# Patient Record
Sex: Female | Born: 2000
Health system: Southern US, Community
[De-identification: ages and names within clinical notes are randomized; demographics above are authoritative.]

## PROBLEM LIST (undated history)

## (undated) DIAGNOSIS — E039 Hypothyroidism, unspecified: Secondary | ICD-10-CM

## (undated) DIAGNOSIS — R7303 Prediabetes: Secondary | ICD-10-CM

## (undated) DIAGNOSIS — E119 Type 2 diabetes mellitus without complications: Secondary | ICD-10-CM

## (undated) HISTORY — DX: Hypothyroidism, unspecified: E03.9

---
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
## 2021-01-20 ENCOUNTER — Encounter (INDEPENDENT_AMBULATORY_CARE_PROVIDER_SITE_OTHER): Payer: Self-pay | Admitting: Psychology

## 2021-02-05 ENCOUNTER — Ambulatory Visit (INDEPENDENT_AMBULATORY_CARE_PROVIDER_SITE_OTHER): Payer: Medicaid Other | Admitting: Pediatric Endocrinology

## 2021-02-06 ENCOUNTER — Telehealth (INDEPENDENT_AMBULATORY_CARE_PROVIDER_SITE_OTHER): Payer: Self-pay | Admitting: Pediatric Endocrinology

## 2021-02-06 NOTE — Telephone Encounter (Signed)
  Who's calling (name and relationship to patient) :Self/ [name]   Best contact number:[phone]  Provider they see:Dr. Vanessa Dawson   Reason for call:[name] Young called because she missed her appointment yesterday, she stated it was due to not having insurance and she thought that she could not be seen. She stated that she will call back in august to schedule when her new insurance is active.      PRESCRIPTION REFILL ONLY  Name of prescription:  Pharmacy:

## 2021-03-12 ENCOUNTER — Other Ambulatory Visit (HOSPITAL_COMMUNITY): Payer: Self-pay

## 2021-03-12 ENCOUNTER — Other Ambulatory Visit (HOSPITAL_BASED_OUTPATIENT_CLINIC_OR_DEPARTMENT_OTHER): Payer: Self-pay

## 2021-04-05 NOTE — Progress Notes (Signed)
Erroneous encounter

## 2021-04-10 ENCOUNTER — Encounter: Payer: Self-pay | Admitting: Family

## 2021-04-10 DIAGNOSIS — Z7689 Persons encountering health services in other specified circumstances: Secondary | ICD-10-CM

## 2021-06-02 ENCOUNTER — Telehealth (INDEPENDENT_AMBULATORY_CARE_PROVIDER_SITE_OTHER): Payer: Self-pay | Admitting: Pharmacist

## 2021-06-02 ENCOUNTER — Encounter (INDEPENDENT_AMBULATORY_CARE_PROVIDER_SITE_OTHER): Payer: Self-pay

## 2021-06-02 NOTE — Telephone Encounter (Signed)
Called patient on 06/02/2021 at 2:48 PM and left HIPAA-compliant VM with instructions to call [facility] back.  Called patient's mother afterwards. She confirmed Courtney has family planning insuracne, but also has other insurance. She states they have a pharmacy insurance card and Amanie is at home and is able to give that information to me. Mom advised me to call to speak with [name]. Advised mother I will re-attempt to contact [name] and if I am unsuccessful I will reach out via MyChart.   Reattempted to call [name] without success. Will contact her via Mychart.  Thank you for involving clinical pharmacist/diabetes educator to assist in providing this patient's care.   Zachery Conch, PharmD, BCACP, CDCES, CPP

## 2021-06-02 NOTE — Telephone Encounter (Signed)
Patient called back - she will email me pictures of the front and back of her Friday insurance card.

## 2021-06-05 ENCOUNTER — Telehealth (INDEPENDENT_AMBULATORY_CARE_PROVIDER_SITE_OTHER): Payer: Self-pay

## 2021-06-05 DIAGNOSIS — E1065 Type 1 diabetes mellitus with hyperglycemia: Secondary | ICD-10-CM

## 2021-06-05 NOTE — Telephone Encounter (Signed)
Patient's insurance cards were just received. I am scanning them into system now. Patient states she is almost out of medication.

## 2021-06-05 NOTE — Telephone Encounter (Signed)
Attempted PA for Humalog Pen, received this message:

## 2021-06-08 ENCOUNTER — Telehealth (INDEPENDENT_AMBULATORY_CARE_PROVIDER_SITE_OTHER): Payer: Self-pay | Admitting: Pediatric Endocrinology

## 2021-06-08 NOTE — Telephone Encounter (Signed)
Attempted PA thru covermymeds with new insurance info, rejected:

## 2021-06-08 NOTE — Telephone Encounter (Signed)
Please re-read prior encounter entered on 06/08/21 at 9:51 am by myself  Patient's name was entered incorrectly as [place] when it is [name]. Also member ID is solely [id number]. Typically when an insurance card has an ID number [id number] the -03 is the dependent status and does not have to be entered.    It was able to submitted appropriately and clinical questions are being requested at this point.   Please enter Key: [id number] on covermymeds and complete prior authorization.  Please do not re-initiate prior authorization

## 2021-06-08 NOTE — Telephone Encounter (Signed)
Patient's name was entered incorrectly as [place] when it is [name]. Also member ID is solely [id number]. Typically when an insurance card has an ID number [id number] the -03 is the dependent status and does not have to be entered.   It was able to submitted appropriately and clinical questions are being requested at this point.  Please enter Key: [id number] on covermymeds and complete prior authorization.  Thank you for involving clinical pharmacist/diabetes educator to assist in providing this patient's care.   Zachery Conch, PharmD, BCACP, CDCES, CPP

## 2021-06-08 NOTE — Telephone Encounter (Signed)
  Who's calling (name and relationship to patient) :Self/ [name] Young   Best contact number:[phone]  Provider they see:Dr. Vanessa [place]   Reason for call:Medication Refill, Patient stated that the pharmacy told her they are still waiting on the prior auth. [name] Young stated that she is leaving to go out of town tomorrow and will not have any to take with her.      PRESCRIPTION REFILL ONLY  Name of prescription:Humalog   Pharmacy:[facility] [place]

## 2021-06-08 NOTE — Telephone Encounter (Signed)
Corrected pt information to state "[name] Young" no abbreviations and re-entered insurance correctly to only include " [id number]" and no additional numbers. PA initiated:

## 2021-06-16 NOTE — Telephone Encounter (Signed)
Humalog Kwickpen 100/unit/ML solution pen injector has been denied per fax received from [employer].

## 2021-06-16 NOTE — Telephone Encounter (Signed)
Looked up patient's drug formulary per insurance coverage  [email or web address] [id number]  It appears patient's insurance plan will only cover 30 day supply (not 90 day supply; 15 mL is 30 day supply and 45 mL is a 90 day supply). It technically does not require a PA (only requires PA for 90 day supply)  I would call the pharmacy and request it be filled for 30 day supply then see if prescription is able to go through insurance.  If unsuccessful then I would contact insurance company [phone]) to explain PA was denied and request to know which alternative rapid acting insulin is covered (insulin aspart (Novolog), insulin lispro (Admelog), insulin glulisine (Apidra). All of these alternative insulins are 100 units/mL and come in a box of 5 pens (total 15 mL) for a 30 day supply   Thank you for involving clinical pharmacist/diabetes educator to assist in providing this patient's care.   Zachery Conch, PharmD, BCACP, CDCES, CPP

## 2021-06-16 NOTE — Telephone Encounter (Signed)
The Determination states:  "Your medication request has been denied as it does not appear to meet medically necessary requirements. Plan rules require clinical parameters (diagnosis, lab values, test results, physical exam findings etc.) be met for medical necessity approval. Information submitted does not indicate required parameter results were met. Coverage for Humalog is denied. It does not meet medical necessity. Humalog has been requested for the treatment of Type 1 diabetes. The information provided by your prescriber does not meet [employer]'s guideline. The guideline used is : Rapid to Intermediate Acting Insulin Prior Authorization with Optional Quantity Limit Criteria. Information provided does not show that the policy requirements have been met.  The requirements not met are:  -an intolerance or hypersensitivity to all the preferred insulin agents of the same type (rapid or regular, mix or NPH) that is not expected to occur with the requested agent; OR -an FDA labeled contraindication to the all preferred insulin agents of the same type (rapid or regular, mix or NPH) that is not expected to occur with the requested agent; OR information indicating a physical or a mental disability that would prevent the use of all the preferred insulin agents; OR Currently pregnant" 

## 2021-06-18 ENCOUNTER — Other Ambulatory Visit (HOSPITAL_BASED_OUTPATIENT_CLINIC_OR_DEPARTMENT_OTHER): Payer: Self-pay

## 2021-06-18 ENCOUNTER — Other Ambulatory Visit (HOSPITAL_COMMUNITY): Payer: Self-pay

## 2021-06-18 MED ORDER — INSULIN ASPART 100 UNIT/ML FLEXPEN
50.0000 [IU] | PEN_INJECTOR | Freq: Every day | SUBCUTANEOUS | 6 refills | Status: DC
  Filled 2021-06-18 (×5): qty 15, 30d supply, fill #0

## 2021-06-18 NOTE — Telephone Encounter (Signed)
Left message for pt telling her of novalog at  [facility].

## 2021-06-18 NOTE — Telephone Encounter (Signed)
Spoke to [place] at [facility]. She stated that the following insulins do not need a PA:  Fiasp Lantus Levemir Tresiba Toujeo Novalog  Since Novalog was on the list provided to me by Dr. Ladona Ridgel, I will resend prescription to pharmacy after provider approval the medication for the pt.

## 2021-06-18 NOTE — Addendum Note (Signed)
Addended by: Buena Irish on: 06/18/2021 12:02 PM   Modules accepted: Orders

## 2021-06-18 NOTE — Telephone Encounter (Signed)
Sent in rx for Novolog   [facility]  [address], [place] [address]  Phone:  [phone]  Fax:  [phone]  DEA #:  [id number]     Quantity remaining: 90 mL Quantity used: 15 mL Next fill due: 06/18/2021  DAW Reason: --  Default refill request to: [facility]    Please notify paitent  Thank you for involving clinical pharmacist/diabetes educator to assist in providing this patient's care.   Zachery Conch, PharmD, BCACP, CDCES, CPP

## 2021-06-19 NOTE — Telephone Encounter (Signed)
Lvm for pt to call back. 

## 2021-06-29 ENCOUNTER — Telehealth (INDEPENDENT_AMBULATORY_CARE_PROVIDER_SITE_OTHER): Payer: Self-pay | Admitting: Pediatric Endocrinology

## 2021-06-29 NOTE — Telephone Encounter (Signed)
  Who's calling (name and relationship to patient) : [name] - self  Best contact number: [phone]  Provider they see: Dr. Vanessa [place]  Reason for call: Patient states that her insulin requires a prior auth.    PRESCRIPTION REFILL ONLY  Name of prescription:  Pharmacy:

## 2021-06-30 ENCOUNTER — Other Ambulatory Visit (INDEPENDENT_AMBULATORY_CARE_PROVIDER_SITE_OTHER): Payer: Self-pay

## 2021-06-30 DIAGNOSIS — E1065 Type 1 diabetes mellitus with hyperglycemia: Secondary | ICD-10-CM

## 2021-06-30 MED ORDER — INSULIN ASPART 100 UNIT/ML FLEXPEN: 50.0000 [IU] | PEN_INJECTOR | Freq: Every day | SUBCUTANEOUS | 6 refills | Status: DC

## 2021-06-30 MED ORDER — LANTUS SOLOSTAR 100 UNIT/ML ~~LOC~~ SOPN: PEN_INJECTOR | SUBCUTANEOUS | 5 refills | Status: DC

## 2021-06-30 NOTE — Telephone Encounter (Signed)
Spoke to patient. Pt requested refils on Humalog and tresiba. I told her that the insurance approved her for Lantus and Novalog instead, pt stated understanding. Pt also expressed that she would like her prescriptions sent to [facility] on [name] [place].    I re-ordered/ placed orders for the correct prescriptions for Novalog and Lantus per Dr Vanessa [place].   Reference was to previous phone note labeled "attempted PA for humalog pen" that stated the following:   "Spoke to [place] at [facility]. She stated that the following insulins do not need a PA:  Fiasp Lantus Levemir [name] Toujeo Novalog"

## 2021-07-01 ENCOUNTER — Telehealth (INDEPENDENT_AMBULATORY_CARE_PROVIDER_SITE_OTHER): Payer: Self-pay

## 2021-07-01 NOTE — Telephone Encounter (Signed)
See phone note labled medication Problem

## 2021-07-01 NOTE — Telephone Encounter (Signed)
Mom called stating that [name] Young had no insulin, long acting.  I told mom that when I spoke to [name] Young yesterday she told me of switching pharmacies to the [facility] on [address] in [place], that I had called them and informed them of the switch and that I was sending those medications in  Lantus and Novalog.  I also explained to mom that insurance did not approve [name] and Humalog and that is why she is now going to get Lantus and Novalog. (See previous phone note ' attempted Humalog PA' for insurance acceptable insulins) Mom stated understanding, and I told mom to call the pharmacy right after we hung up to check if meds were ready for pickup and if they weren't ready to call me back.

## 2021-07-01 NOTE — Telephone Encounter (Signed)
°  Who's calling (name and relationship to patient) :mom/ Joann   Best contact number:[phone]  Provider they see:Dr. Vanessa [place]   Reason for call:Mom stated that Salinda has been without medication and the pharmacy has told her that they the office has still not sent the PA for these medications.      PRESCRIPTION REFILL ONLY  Name of prescription:Humalog, Lantus   Pharmacy:[facility] [address] [place], [place]

## 2021-09-14 ENCOUNTER — Other Ambulatory Visit: Payer: Self-pay

## 2021-09-14 ENCOUNTER — Ambulatory Visit (INDEPENDENT_AMBULATORY_CARE_PROVIDER_SITE_OTHER): Payer: 59 | Admitting: Pediatric Endocrinology

## 2021-09-14 ENCOUNTER — Encounter (INDEPENDENT_AMBULATORY_CARE_PROVIDER_SITE_OTHER): Payer: Self-pay | Admitting: Pediatric Endocrinology

## 2021-09-14 VITALS — BP 120/76 | HR 106 | Wt 183.2 lb

## 2021-09-14 DIAGNOSIS — G63 Polyneuropathy in diseases classified elsewhere: Secondary | ICD-10-CM

## 2021-09-14 DIAGNOSIS — E349 Endocrine disorder, unspecified: Secondary | ICD-10-CM

## 2021-09-14 DIAGNOSIS — E1065 Type 1 diabetes mellitus with hyperglycemia: Secondary | ICD-10-CM

## 2021-09-14 LAB — POCT GLUCOSE (DEVICE FOR HOME USE): POC Glucose: 376 mg/dl — AB (ref 70–99)

## 2021-09-14 LAB — POCT GLYCOSYLATED HEMOGLOBIN (HGB A1C): Hemoglobin A1C: 13 % — AB (ref 4.0–5.6)

## 2021-09-14 MED ORDER — TRESIBA FLEXTOUCH 100 UNIT/ML ~~LOC~~ SOPN: PEN_INJECTOR | SUBCUTANEOUS | 1 refills | Status: DC

## 2021-09-14 MED ORDER — DEXCOM G6 TRANSMITTER MISC: 1.0000 | 3 refills | Status: AC

## 2021-09-14 MED ORDER — FIASP FLEXTOUCH 100 UNIT/ML ~~LOC~~ SOPN: PEN_INJECTOR | SUBCUTANEOUS | 5 refills | Status: DC

## 2021-09-14 MED ORDER — DEXCOM G6 SENSOR MISC: 1.0000 | 11 refills | Status: DC

## 2021-09-14 NOTE — Progress Notes (Signed)
Pediatric Endocrinology Diabetes Consultation Follow-up Visit  SUPRIYA BEASTON [birth year] [id number]  Chief Complaint: Follow-up type 1 diabetes   Inc, [facility]   HPI: Cecylia  is a 21 y.o. female presenting for follow-up of type 1 diabetes. she is unaccompanied.   1. Brailee was first referred to [place] for concerns regarding hypothyroidism and obesity. At her initial visit she was found to have an elevated A1C of 6.6%. She has a strong family history of type 2 diabetes and her family believed that she was following the same pattern. About 1 month after our initial consult she was admitted to [facility] from [facility] with a blood sugar of 674 mg/dL on 06/14/11. She had been complaining of stomach pains. Her mom checked her on mom's meter and found that the read was HI. Mom had been checking Ninette's sugars about 1-2 x per week since her last visit and stated that none of the reads had been higher than 147. She was transferred from [facility] to [facility] and diagnosed with hyperglycemia secondary to diabetes. She had glycosuria but no ketones.  She was started on both Metformin and MDI with Lantus and Novolog. Given her strong family history of type 2 diabetes and acanthosis nigricans, Cassiopeia was at risk for type 2 diabetes. Given her history of Hashimoto Thyroiditis, she was also at risk for Type 1 diabetes. 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    2. Since last visit to [facility] on 07/28/20 , she has been doing okay.   She is currently getting Lantus and Humalog. She is paying $20 for each at the pharmacy. She is taking Lantus 30 units 3-4 times a week. She is taking it at bedtime. If she forgets it she just takes Humalog the next day.   She is checking her BG about 3-4 times a week. She is taking her Humalog daily- she thinks that she does this more often than she  checks her sugar.   She has been having leg pains. She says that she has neuropathy. She has not yet seen neurology. She is getting foot cramps in the middle of the night.   She has had a few low sugars- but not bad. She was 62 this morning.    Thyroid She is not taking any of her thyroid medication.   She has been somewhat more tired. She does not nap "I don't have time for naps" She is getting her period monthly No issues with her hair or skin.  She is constipated She is often cold.    Microalbumin - not currently taking Lisinopril regularly.  - She does not remember the last time she took it.    Insulin regimen:   Lantus 30  Humalog 120/30/10 plan- mostly making it up as she goes.   Hypoglycemia: Occasional hypoglycemia- mostly when she doesn't eat. some overnight. No glucagon needed.  Blood glucose download:      Dexcom : not currently wearing     Med-alert ID: Not currently wearing. - need to get one.  Injection sites: abdomen, arms and legs   Annual labs due: Due today Ophthalmology due: done 2022   3. ROS:  Greater than 10 systems reviewed with pertinent positives listed in HPI, otherwise neg.    Constitutional: She is feeling "OK".. normal appetite  Eyes: She has glasses now- she feels that she needs a stronger prescription Ears/Nose/Mouth/Throat:  No difficulty swallowing. Cardiovascular: No palpitations Respiratory: No increased work of breathing.  Gastrointestinal: Recent constipation Neurologic: Normal sensation, no tremor- new neuropathy Endocrine: No polydipsia.  No hyperpigmentation Psychiatric: appropriate LMP 2/5- periods are regular  Past Medical History:   Past Medical History:  Diagnosis Date   Borderline diabetes    Diabetes mellitus without complication ([place])    Hypothyroid    Hypothyroidism     Medications:  Outpatient Encounter Medications as of 09/14/2021  Medication Sig   Continuous Blood Gluc Sensor (DEXCOM G6 SENSOR) MISC 1 each  by Does not apply route as directed. 1 sensor every 10 days   Continuous Blood Gluc Transmit (DEXCOM G6 TRANSMITTER) MISC 1 each by Does not apply route every 3 (three) months.   glucose blood (FREESTYLE LITE) test strip Check glucose 6x daily   glucose monitoring kit (FREESTYLE) monitoring kit 1 each by Does not apply route as needed for other.   insulin aspart (FIASP FLEXTOUCH) 100 UNIT/ML FlexTouch Pen Inject up to 50 units subcutaneously daily as instructed.   insulin degludec (TRESIBA FLEXTOUCH) 100 UNIT/ML FlexTouch Pen Use as directed up to 45 units per day   Insulin Pen Needle (BD PEN NEEDLE NANO U/F) 32G X 4 MM MISC Use with insulin pen 6x daily   Ketone Blood Test STRP Use as directed   levothyroxine (SYNTHROID) 75 MCG tablet TAKE 1 TABLET BY MOUTH ONCE DAILY.   [DISCONTINUED] insulin aspart (NOVOLOG) 100 UNIT/ML FlexPen Inject up to 50 Units into the skin daily per provider guidance   [DISCONTINUED] insulin glargine (LANTUS SOLOSTAR) 100 UNIT/ML Solostar Pen Inject up to 50 Units daily   Glucagon (BAQSIMI TWO PACK) 3 MG/DOSE POWD Place 1 spray into the nose as directed. Please apply copay card. BIN [id number]. PCN [id number]. GROUP [id number]. ID [id number]. Thanks! (Patient not taking: Reported on 04/24/2020)   glucagon 1 MG injection Give 1 mg IM for severe hypoglycemia with seizure or unconsciousness.   lisinopril (ZESTRIL) 5 MG tablet Take 1 tablet (5 mg total) by mouth daily. (Patient not taking: Reported on 09/14/2021)   [DISCONTINUED] Continuous Blood Gluc Sensor (DEXCOM G6 SENSOR) MISC Inject 1 applicator into the skin as directed. (change sensor every 10 days) (Patient not taking: Reported on 09/14/2021)   [DISCONTINUED] Continuous Blood Gluc Transmit (DEXCOM G6 TRANSMITTER) MISC Inject 1 Device into the skin as directed. (re-use up to 8x with each new sensor) (Patient not taking: Reported on 09/14/2021)   No facility-administered encounter medications on file as of 09/14/2021.    Allergies:   Allergies  Allergen Reactions   Apple Juice Itching    Throat itching and burning   Latex     Skin becomes red and itchy    Surgical History: No past surgical history on file.  Family History:  Family History  Problem Relation Age of Onset   Diabetes Mother    Obesity Mother    Thyroid disease Mother    Diabetes Maternal Grandmother    Hypertension Maternal Grandfather    Thyroid disease Maternal Grandfather    Asthma Brother       Social History:   Lives with: Mother, father and younger brother   Graduated HS Working at [employer]   Physical Exam:   Vitals:   09/14/21 0933  BP: 120/76  Pulse: (!) 106  Weight: 183 lb 3.2 oz (83.1 kg)    BP 120/76 (BP Location: Right Arm, Patient Position: Sitting, Cuff Size: Normal)    Pulse (!) 106    Wt  183 lb 3.2 oz (83.1 kg)    BMI 28.69 kg/m  Body mass index: body mass index is 28.69 kg/m.  Facility age limit for growth percentiles is 20 years.  Growth percentile SmartLinks can only be used for patients less than 49 years old.  Ht Readings from Last 3 Encounters:  05/30/20 '5\' 7"'  (1.702 m) (86 %, Z= 1.07)*  03/05/20 5' 7.72" (1.72 m) (91 %, Z= 1.36)*  01/23/20 5' 7.87" (1.724 m) (92 %, Z= 1.42)*   * Growth percentiles are based on CDC (Girls, 2-20 Years) data.   Wt Readings from Last 3 Encounters:  09/14/21 183 lb 3.2 oz (83.1 kg)  07/28/20 172 lb (78 kg) (93 %, Z= 1.47)*  05/30/20 170 lb (77.1 kg) (93 %, Z= 1.44)*   * Growth percentiles are based on CDC (Girls, 2-20 Years) data.    General: Well developed, well nourished female in no acute distress.  Alert and oriented. Weight is plus 11 pounds since last visit. (1 year) Head: Normocephalic, atraumatic.   Eyes:  Pupils equal and round. EOMI.   Sclera white.  No eye drainage.   Ears/Nose/Mouth/Throat: Nares patent, no nasal drainage.  Normal dentition, mucous membranes moist.   Neck: supple, no cervical lymphadenopathy, Thyroid enlarged, firm, slightly tender,  today Cardiovascular: regular rate, normal S1/S2, no murmurs Respiratory: No increased work of breathing.  Lungs clear to auscultation bilaterally.  No wheezes. Abdomen: mildly distended. Normal bowel sounds.  No appreciable masses. LLQ tenderness.  Extremities: warm, well perfused, cap refill < 2 sec.   Musculoskeletal: Normal muscle mass.  Normal strength Skin: warm, dry.  No rash or lesions. Neurologic: alert and oriented, normal speech, no tremor  Labs:     Lab Results  Component Value Date   HGBA1C 13.0 (A) 09/14/2021   HGBA1C 13.0 (A) 07/28/2020   HGBA1C 10.9 (A) 04/24/2020     Results for orders placed or performed in visit on 09/14/21  POCT Glucose (Device for Home Use)  Result Value Ref Range   Glucose Fasting, POC     POC Glucose 376 (A) 70 - 99 mg/dl  POCT glycosylated hemoglobin (Hb A1C)  Result Value Ref Range   Hemoglobin A1C 13.0 (A) 4.0 - 5.6 %   HbA1c POC (<> result, manual entry)     HbA1c, POC (prediabetic range)     HbA1c, POC (controlled diabetic range)      Assessment/Plan: Pleshette is a 21 y.o. female with uncontrolled type 1 diabetes on MDI. She has been lost to follow up for the past year.   1. DM w/o complication type I, uncontrolled (HCC)/Hyperglycemia / Neuropathy - 30 units of Lantus  - Has been taking dose about 3 times a week  - Lantus requires PA on her new insurance  - Will switch back to [place] which does not appear to require PA on her insurance (per website) - Humalog   120/30/10 scale- but she is mostly making up doses  - Humalog requires PA on her current insurance  - Will switch to [place] which does not appear to require PA on her insurance website  - Discussed possibly starting insulin pump if we can get her doses closer to her actual insulin dose requirements Hemoglobin A1C  - Her value is far above the ADA recommended value of <7%  - Discussed increased risk of complications with high Y1O results  - She is already having  increasing neuropathy Neuropathy  - Lower extremity/ foot pain  - Bruising sensation,  walking on sharp object pain, and tingling pain  - Worse at night/ in bed  - normal sensation to monofilament testing on exam today  - She declines Gabapentin at this time but states that she will work on improving glycemic control Continuous Glucose Monitoring  - Her insurance appears to cover Dexcom 6 but not Dexcom 7  - Rx for CGM supplies to pharmacy today  2. Medical non-compliance   - Discussed barriers to care.  - Discussed challenges with missed appointments, inadequate data, and inconsistent dosing  3. Hypothyroid - 75 mcg of Levothyroxine was her last dose - She has not been taking this for a long time - Gland is large and slightly tender on exam today - she denies overt symptoms of hypothyroidism  4. Microalbuminuria - Not taking Lisinopril - BP stable  Orders Placed This Encounter  Procedures   TSH   T4, free   CBC with Differential/Platelet   Comprehensive metabolic panel   Lipid panel   Microalbumin / creatinine urine ratio   POCT Glucose (Device for Home Use)   POCT urinalysis dipstick   POCT glycosylated hemoglobin (Hb A1C)   COLLECTION CAPILLARY BLOOD SPECIMEN   Meds ordered this encounter  Medications   Continuous Blood Gluc Sensor (DEXCOM G6 SENSOR) MISC    Sig: 1 each by Does not apply route as directed. 1 sensor every 10 days    Dispense:  3 each    Refill:  11   Continuous Blood Gluc Transmit (DEXCOM G6 TRANSMITTER) MISC    Sig: 1 each by Does not apply route every 3 (three) months.    Dispense:  1 each    Refill:  3   insulin degludec (TRESIBA FLEXTOUCH) 100 UNIT/ML FlexTouch Pen    Sig: Use as directed up to 45 units per day    Dispense:  15 mL    Refill:  1   insulin aspart (FIASP FLEXTOUCH) 100 UNIT/ML FlexTouch Pen    Sig: Inject up to 50 units subcutaneously daily as instructed.    Dispense:  15 mL    Refill:  5     Follow-up:  Return in about 1  month (around 10/12/2021).    Medical decision-making:  >40 minutes spent today reviewing the medical chart, counseling the patient/family, and documenting today's encounter.   When a patient is on insulin, intensive monitoring of blood glucose levels is necessary to avoid hyperglycemia and hypoglycemia. Severe hyperglycemia/hypoglycemia can lead to hospital admissions and be life threatening.   Lelon Huh, MD  Pediatric Specialist  [address] [place]  [place], [address]  Tele: [phone]

## 2021-09-15 LAB — LIPID PANEL
Cholesterol: 237 mg/dL — ABNORMAL HIGH (ref <200)
HDL: 76 mg/dL (ref >=50)
LDL Cholesterol (Calc): 134 mg/dL (calc) — ABNORMAL HIGH
Non-HDL Cholesterol (Calc): 161 mg/dL (calc) — ABNORMAL HIGH (ref <130)
Total CHOL/HDL Ratio: 3.1 (calc) (ref <5.0)
Triglycerides: 138 mg/dL (ref <150)

## 2021-09-15 LAB — COMPREHENSIVE METABOLIC PANEL
AG Ratio: 1.1 (calc) (ref 1.0–2.5)
ALT: 18 U/L (ref 6–29)
AST: 20 U/L (ref 10–30)
Albumin: 4 g/dL (ref 3.6–5.1)
Alkaline phosphatase (APISO): 134 U/L — ABNORMAL HIGH (ref 31–125)
BUN: 9 mg/dL (ref 7–25)
CO2: 23 mmol/L (ref 20–32)
Calcium: 9.1 mg/dL (ref 8.6–10.2)
Chloride: 95 mmol/L — ABNORMAL LOW (ref 98–110)
Creat: 0.66 mg/dL (ref 0.50–0.96)
Globulin: 3.6 g/dL (calc) (ref 1.9–3.7)
Glucose, Bld: 395 mg/dL — ABNORMAL HIGH (ref 65–99)
Potassium: 3.8 mmol/L (ref 3.5–5.3)
Sodium: 133 mmol/L — ABNORMAL LOW (ref 135–146)
Total Bilirubin: 0.6 mg/dL (ref 0.2–1.2)
Total Protein: 7.6 g/dL (ref 6.1–8.1)

## 2021-09-15 LAB — CBC WITH DIFFERENTIAL/PLATELET
Absolute Monocytes: 441 cells/uL (ref 200–950)
Basophils Absolute: 38 cells/uL (ref 0–200)
Basophils Relative: 0.6 %
Eosinophils Absolute: 69 cells/uL (ref 15–500)
Eosinophils Relative: 1.1 %
HCT: 37 % (ref 35.0–45.0)
Hemoglobin: 10.9 g/dL — ABNORMAL LOW (ref 11.7–15.5)
Lymphs Abs: 1424 cells/uL (ref 850–3900)
MCH: 23.7 pg — ABNORMAL LOW (ref 27.0–33.0)
MCHC: 29.5 g/dL — ABNORMAL LOW (ref 32.0–36.0)
MCV: 80.4 fL (ref 80.0–100.0)
MPV: 10.6 fL (ref 7.5–12.5)
Monocytes Relative: 7 %
Neutro Abs: 4328 cells/uL (ref 1500–7800)
Neutrophils Relative %: 68.7 %
Platelets: 376 10*3/uL (ref 140–400)
RBC: 4.6 10*6/uL (ref 3.80–5.10)
RDW: 13.5 % (ref 11.0–15.0)
Total Lymphocyte: 22.6 %
WBC: 6.3 10*3/uL (ref 3.8–10.8)

## 2021-09-15 LAB — TSH: TSH: 5.52 mIU/L — ABNORMAL HIGH

## 2021-09-15 LAB — MICROALBUMIN / CREATININE URINE RATIO
Creatinine, Urine: 40 mg/dL (ref 20–275)
Microalb Creat Ratio: 13 mcg/mg creat (ref <30)
Microalb, Ur: 0.5 mg/dL

## 2021-09-15 LAB — T4, FREE: Free T4: 1 ng/dL (ref 0.8–1.4)

## 2021-09-28 ENCOUNTER — Other Ambulatory Visit (HOSPITAL_BASED_OUTPATIENT_CLINIC_OR_DEPARTMENT_OTHER): Payer: Self-pay

## 2021-10-12 ENCOUNTER — Other Ambulatory Visit: Payer: Self-pay

## 2021-10-12 ENCOUNTER — Ambulatory Visit (INDEPENDENT_AMBULATORY_CARE_PROVIDER_SITE_OTHER): Payer: 59 | Admitting: Pediatric Endocrinology

## 2021-10-12 VITALS — BP 120/90 | Wt 177.8 lb

## 2021-10-12 DIAGNOSIS — E1065 Type 1 diabetes mellitus with hyperglycemia: Secondary | ICD-10-CM

## 2021-10-12 DIAGNOSIS — G6289 Other specified polyneuropathies: Secondary | ICD-10-CM | POA: Diagnosis not present

## 2021-10-12 DIAGNOSIS — E034 Atrophy of thyroid (acquired): Secondary | ICD-10-CM | POA: Diagnosis not present

## 2021-10-12 LAB — POCT GLUCOSE (DEVICE FOR HOME USE): Glucose Fasting, POC: 87 mg/dL (ref 70–99)

## 2021-10-12 MED ORDER — FIASP FLEXTOUCH 100 UNIT/ML ~~LOC~~ SOPN: PEN_INJECTOR | SUBCUTANEOUS | 5 refills | Status: AC

## 2021-10-12 MED ORDER — FREESTYLE LIBRE 3 SENSOR MISC: 1.0000 | 11 refills | Status: DC

## 2021-10-12 MED ORDER — LEVOTHYROXINE SODIUM 25 MCG PO TABS: 25.0000 ug | ORAL_TABLET | Freq: Every day | ORAL | 3 refills | Status: DC

## 2021-10-12 MED ORDER — ACCU-CHEK GUIDE W/DEVICE KIT: PACK | 1 refills | Status: AC

## 2021-10-12 MED ORDER — GLUCOSE BLOOD VI STRP: ORAL_STRIP | 12 refills | Status: DC

## 2021-10-12 MED ORDER — BAQSIMI TWO PACK 3 MG/DOSE NA POWD: 1.0000 | NASAL | 3 refills | Status: AC

## 2021-10-12 MED ORDER — ACCU-CHEK MULTICLIX LANCETS MISC: 12 refills | Status: DC

## 2021-10-12 MED ORDER — TRESIBA FLEXTOUCH 100 UNIT/ML ~~LOC~~ SOPN: PEN_INJECTOR | SUBCUTANEOUS | 1 refills | Status: AC

## 2021-10-12 MED ORDER — ACCU-CHEK GUIDE VI STRP: ORAL_STRIP | 3 refills | Status: AC

## 2021-10-12 MED ORDER — ACCU-CHEK FASTCLIX LANCETS MISC: 3 refills | Status: AC

## 2021-10-12 NOTE — Patient Instructions (Addendum)
?  All your scripts are at [facility] ? ?If you have any issues filling them- please send me a MyChart.  ?Otherwise- I will expect that you are using all your new meds at your next visit! ? ?Call Environmental manager at [facility] and ask if they have had a chance to review your resume. Don't be afraid to be pushy.  ? ?Sample of Libre 3 provided in clinic today. Rx sent to pharmacy. If it is denied- call the number on the bag: [phone] ? ? ?

## 2021-10-12 NOTE — Progress Notes (Signed)
Pediatric Endocrinology Diabetes Consultation Follow-up Visit ? ?Lorane Gell ?2000-12-11 ?[id number] ? ?Chief Complaint: Follow-up type 1 diabetes ? ? ?Inc, [facility] ? ? ?HPI: ?[name] Young  is a 21 y.o. female presenting for follow-up of type 1 diabetes. she is unaccompanied.  ? ?1. Latona was first referred to [place] for concerns regarding hypothyroidism and obesity. At her initial visit she was found to have an elevated A1C of 6.6%. She has a strong family history of type 2 diabetes and her family believed that she was following the same pattern. About 1 month after our initial consult she was admitted to [facility] from [facility] with a blood sugar of 674 mg/dL on 06/14/11. She had been complaining of stomach pains. Her mom checked her on mom's meter and found that the read was HI. Mom had been checking Karely's sugars about 1-2 x per week since her last visit and stated that none of the reads had been higher than 147. She was transferred from [facility] to [facility] and diagnosed with hyperglycemia secondary to diabetes. She had glycosuria but no ketones.  She was started on both Metformin and MDI with Lantus and Novolog. Given her strong family history of type 2 diabetes and acanthosis nigricans, Rhilyn was at risk for type 2 diabetes. Given her history of Hashimoto Thyroiditis, she was also at risk for Type 1 diabetes. 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   ? ?2. Since last visit to [facility] on 09/14/21 , she has been doing okay.  She has been working on being more consistent with taking her medications.  ? ?She is getting her Lantus about 5 days a week now (up from 3-4 nights a week).  ?She is taking Humalog- about 5-7 days a week - for carbs mostly.  ?She is looking at her sugar still around 3 times a week. She ran out of Accucheck Guide strips and got a Relion Meter from [place]  instead.  ? ?She would like to restart on a CGM.  ? ?She has continued to have pain in her legs and her feet. She feels that this is neuropathy. She has not gotten a referral to neurology.  ? ?Thyroid ?She is still not taking any of her thyroid medication.  ? ?She has been somewhat more tired. She does not nap "I don't have time for naps" ?She is getting her period monthly ?No issues with her hair or skin.  ?She is less constipated ?She is often cold.  ? ? ?Microalbumin ?- not currently taking Lisinopril regularly.  ?- She does not remember the last time she took it.  ?- Microalbumin Creatinine ratio was normal in February 2023 ? ? ?Insulin regimen:   Lantus 30  Humalog 120/30/10 plan- mostly making it up as she goes. (10/25/10/16 correction- rarely uses). She guesses on her carbs ? ?Hypoglycemia: Occasional hypoglycemia- mostly when she doesn't eat. some overnight. No glucagon needed.  ?Blood glucose download:  unable to download Relion Meter ? ?Dexcom : not currently wearing ? ?   ?Med-alert ID: Not currently wearing. - need to get one.  ?Injection sites: abdomen, arms and legs   ?Annual labs due: Done last visit. Mild hypothyroidism.  ?Ophthalmology due: done 2022 ? ? ?3. ROS:  Greater than 10 systems reviewed with pertinent positives listed in HPI, otherwise neg.    ?Constitutional: She is feeling "I feel low".. normal appetite  ?  Eyes: She has glasses now- she feels that she needs a stronger prescription ?Ears/Nose/Mouth/Throat: No difficulty swallowing. ?Cardiovascular: No palpitations ?Respiratory: No increased work of breathing.  ?Gastrointestinal: Recent constipation ?Neurologic: Normal sensation, no tremor- new neuropathy ?Endocrine: No polydipsia.  No hyperpigmentation ?Psychiatric: appropriate ?LMP 3/6 periods are regular ? ?Past Medical History:   ?Past Medical History:  ?Diagnosis Date  ? Borderline diabetes   ? Diabetes mellitus without complication ([place])   ? Hypothyroid   ? Hypothyroidism    ? ? ?Medications:  ?Outpatient Encounter Medications as of 10/12/2021  ?Medication Sig  ? Accu-Chek FastClix Lancets MISC Check sugar up to 6 times daily. For use with FAST CLIX Lancet Device  ? Blood Glucose Monitoring Suppl (ACCU-CHEK GUIDE) w/Device KIT Use to check sugars up to 6x daily  ? Continuous Blood Gluc Sensor (FREESTYLE LIBRE 3 SENSOR) MISC 1 each by Does not apply route every 14 (fourteen) days. Place 1 sensor on the skin every 14 days. Use to check glucose continuously  ? glucose blood (ACCU-CHEK GUIDE) test strip Use as instructed for 6 checks per day plus per protocol for hyper/hypoglycemia  ? Ketone Blood Test STRP Use as directed  ? [DISCONTINUED] Glucagon (BAQSIMI TWO PACK) 3 MG/DOSE POWD Place 1 spray into the nose as directed. Please apply copay card. BIN [id number]. PCN [id number]. GROUP [id number]. ID [id number]. Thanks!  ? [DISCONTINUED] Lancets (ACCU-CHEK MULTICLIX) lancets Use to check sugar up to 6x daily  ? Continuous Blood Gluc Transmit (DEXCOM G6 TRANSMITTER) MISC 1 each by Does not apply route every 3 (three) months. (Patient not taking: Reported on 10/12/2021)  ? Glucagon (BAQSIMI TWO PACK) 3 MG/DOSE POWD Place 1 spray into the nose as directed. Please apply copay card. BIN [id number]. PCN [id number]. GROUP [id number]. ID [id number]. Thanks!  ? glucagon 1 MG injection Give 1 mg IM for severe hypoglycemia with seizure or unconsciousness.  ? glucose monitoring kit (FREESTYLE) monitoring kit 1 each by Does not apply route as needed for other. (Patient not taking: Reported on 10/12/2021)  ? insulin aspart (FIASP FLEXTOUCH) 100 UNIT/ML FlexTouch Pen Inject up to 50 units subcutaneously daily as instructed.  ? insulin degludec (TRESIBA FLEXTOUCH) 100 UNIT/ML FlexTouch Pen Use as directed up to 45 units per day  ? Insulin Pen Needle (BD PEN NEEDLE NANO U/F) 32G X 4 MM MISC Use with insulin pen 6x daily  ? levothyroxine (SYNTHROID) 25 MCG tablet Take 1 tablet (25 mcg total) by mouth daily.  ? [DISCONTINUED] Continuous  Blood Gluc Sensor (DEXCOM G6 SENSOR) MISC 1 each by Does not apply route as directed. 1 sensor every 10 days (Patient not taking: Reported on 10/12/2021)  ? [DISCONTINUED] glucose blood (FREESTYLE LITE) test strip Check glucose 6x daily (Patient not taking: Reported on 10/12/2021)  ? [DISCONTINUED] glucose blood test strip Use to check sugar up to 6x daily (Patient not taking: Reported on 10/12/2021)  ? [DISCONTINUED] insulin aspart (FIASP FLEXTOUCH) 100 UNIT/ML FlexTouch Pen Inject up to 50 units subcutaneously daily as instructed. (Patient not taking: Reported on 10/12/2021)  ? [DISCONTINUED] insulin degludec (TRESIBA FLEXTOUCH) 100 UNIT/ML FlexTouch Pen Use as directed up to 45 units per day (Patient not taking: Reported on 10/12/2021)  ? [DISCONTINUED] levothyroxine (SYNTHROID) 75 MCG tablet TAKE 1 TABLET BY MOUTH ONCE DAILY.  ? [DISCONTINUED] lisinopril (ZESTRIL) 5 MG tablet Take 1 tablet (5 mg total) by mouth daily. (Patient not taking: Reported on 09/14/2021)  ? ?No facility-administered encounter medications on file as of 10/12/2021.  ? ? ?Allergies:  ?  Allergies  ?Allergen Reactions  ? Apple Juice Itching  ?  Throat itching and burning  ? Latex   ?  Skin becomes red and itchy  ? ? ?Surgical History: ?No past surgical history on file. ? ?Family History:  ?Family History  ?Problem Relation Age of Onset  ? Diabetes Mother   ? Obesity Mother   ? Thyroid disease Mother   ? Diabetes Maternal Grandmother   ? Hypertension Maternal Grandfather   ? Thyroid disease Maternal Grandfather   ? Asthma Brother   ? ?  ? ?Social History:   ?Lives with: Mother, father and younger brother   ?Graduated HS ?Working at "[employer]" she applied for a bunch of jobs at [employer].  ? ? ?Physical Exam:   ?Vitals:  ? 10/12/21 0954  ?BP: 120/90  ?Weight: 177 lb 12.8 oz (80.6 kg)  ?  ?BP 120/90 (BP Location: Right Arm, Patient Position: Sitting, Cuff Size: Large)   Wt 177 lb 12.8 oz (80.6 kg)   BMI 27.85 kg/m?  ?Body mass index: body mass index  is 27.85 kg/m?.  ?Facility age limit for growth percentiles is 20 years. ? ?Growth percentile SmartLinks can only be used for patients less than 46 years old. ? ?Ht Readings from Last 3 Encounters:  ?05/30/20 _0  (1.70

## 2021-11-12 ENCOUNTER — Ambulatory Visit (INDEPENDENT_AMBULATORY_CARE_PROVIDER_SITE_OTHER): Payer: 59 | Admitting: Pediatric Endocrinology

## 2022-03-15 ENCOUNTER — Encounter (INDEPENDENT_AMBULATORY_CARE_PROVIDER_SITE_OTHER): Payer: Self-pay

## 2022-07-15 ENCOUNTER — Other Ambulatory Visit (INDEPENDENT_AMBULATORY_CARE_PROVIDER_SITE_OTHER): Payer: Self-pay | Admitting: Pediatric Endocrinology

## 2022-07-15 ENCOUNTER — Other Ambulatory Visit (HOSPITAL_BASED_OUTPATIENT_CLINIC_OR_DEPARTMENT_OTHER): Payer: Self-pay

## 2022-07-15 DIAGNOSIS — E034 Atrophy of thyroid (acquired): Secondary | ICD-10-CM

## 2022-07-15 MED ORDER — INSULIN DEGLUDEC 100 UNIT/ML ~~LOC~~ SOPN
50.0000 [IU] | PEN_INJECTOR | Freq: Every day | SUBCUTANEOUS | 1 refills | Status: AC
  Filled 2022-07-15: qty 45, 90d supply, fill #0
  Filled 2022-08-19: qty 15, 30d supply, fill #0
  Filled 2023-05-10: qty 45, 90d supply, fill #0

## 2022-07-15 MED ORDER — LEVOTHYROXINE SODIUM 25 MCG PO TABS
25.0000 ug | ORAL_TABLET | Freq: Every day | ORAL | 0 refills | Status: DC
  Filled 2022-07-15: qty 90, 90d supply, fill #0

## 2022-07-15 MED ORDER — INSULIN LISPRO (1 UNIT DIAL) 100 UNIT/ML (KWIKPEN)
50.0000 [IU] | PEN_INJECTOR | Freq: Every day | SUBCUTANEOUS | 1 refills | Status: DC
Start: 2022-07-15 — End: 2023-01-11
  Filled 2022-07-15: qty 45, 90d supply, fill #0
  Filled 2022-08-19: qty 15, 30d supply, fill #1
  Filled 2022-08-19 – 2022-10-14 (×3): qty 45, 90d supply, fill #1

## 2022-07-16 ENCOUNTER — Telehealth (INDEPENDENT_AMBULATORY_CARE_PROVIDER_SITE_OTHER): Payer: Self-pay

## 2022-07-16 ENCOUNTER — Other Ambulatory Visit (HOSPITAL_BASED_OUTPATIENT_CLINIC_OR_DEPARTMENT_OTHER): Payer: Self-pay

## 2022-07-16 NOTE — Telephone Encounter (Signed)
Fax from [facility] stated that pts [name] Young needs a PA. Initiated PA on covermymeds:Key: [id number]

## 2022-07-20 ENCOUNTER — Other Ambulatory Visit (HOSPITAL_BASED_OUTPATIENT_CLINIC_OR_DEPARTMENT_OTHER): Payer: Self-pay

## 2022-07-21 ENCOUNTER — Other Ambulatory Visit (HOSPITAL_BASED_OUTPATIENT_CLINIC_OR_DEPARTMENT_OTHER): Payer: Self-pay

## 2022-07-21 MED ORDER — FIASP FLEXTOUCH 100 UNIT/ML ~~LOC~~ SOPN
PEN_INJECTOR | SUBCUTANEOUS | 3 refills | Status: AC
  Filled 2022-07-21: qty 15, 16d supply, fill #0
  Filled 2022-08-19 – 2022-09-10 (×3): qty 15, 16d supply, fill #1

## 2022-07-21 MED ORDER — GABAPENTIN 300 MG PO CAPS
300.0000 mg | ORAL_CAPSULE | Freq: Two times a day (BID) | ORAL | 3 refills | Status: AC
  Filled 2022-07-21: qty 60, 30d supply, fill #0
  Filled 2022-10-14: qty 60, 30d supply, fill #1
  Filled 2023-02-19: qty 60, 30d supply, fill #2

## 2022-07-21 MED ORDER — TRESIBA FLEXTOUCH 100 UNIT/ML ~~LOC~~ SOPN
PEN_INJECTOR | SUBCUTANEOUS | 3 refills | Status: AC
  Filled 2022-07-21: qty 15, 33d supply, fill #0
  Filled 2022-08-19: qty 15, 33d supply, fill #1
  Filled 2022-08-23: qty 12, 27d supply, fill #1
  Filled 2022-10-14: qty 15, 33d supply, fill #1
  Filled 2022-12-13: qty 15, 33d supply, fill #2
  Filled 2023-01-27: qty 15, 33d supply, fill #3

## 2022-07-21 MED ORDER — LEVOTHYROXINE SODIUM 25 MCG PO TABS
25.0000 ug | ORAL_TABLET | Freq: Every day | ORAL | 3 refills | Status: AC
  Filled 2022-07-21 – 2022-10-14 (×2): qty 30, 30d supply, fill #0
  Filled 2023-03-15: qty 30, 30d supply, fill #1
  Filled 2023-05-10 (×2): qty 30, 30d supply, fill #2

## 2022-07-21 MED ORDER — FIASP FLEXTOUCH 100 UNIT/ML ~~LOC~~ SOPN
PEN_INJECTOR | SUBCUTANEOUS | 3 refills | Status: AC
  Filled 2022-07-21: qty 3, 3d supply, fill #0

## 2022-07-21 MED ORDER — GLUCOSE BLOOD VI STRP
1.0000 | ORAL_STRIP | 3 refills | Status: DC
  Filled 2022-07-21: qty 150, 34d supply, fill #0
  Filled 2023-04-08: qty 150, 34d supply, fill #1

## 2022-07-22 ENCOUNTER — Other Ambulatory Visit (HOSPITAL_BASED_OUTPATIENT_CLINIC_OR_DEPARTMENT_OTHER): Payer: Self-pay

## 2022-07-23 NOTE — Telephone Encounter (Signed)
Received fax from [facility] stating that PA not handled by them.

## 2022-08-10 ENCOUNTER — Other Ambulatory Visit (HOSPITAL_BASED_OUTPATIENT_CLINIC_OR_DEPARTMENT_OTHER): Payer: Self-pay

## 2022-08-10 MED ORDER — OSELTAMIVIR PHOSPHATE 75 MG PO CAPS
75.0000 mg | ORAL_CAPSULE | Freq: Two times a day (BID) | ORAL | 0 refills | Status: AC
  Filled 2022-08-10: qty 10, 5d supply, fill #0

## 2022-08-11 ENCOUNTER — Other Ambulatory Visit (HOSPITAL_BASED_OUTPATIENT_CLINIC_OR_DEPARTMENT_OTHER): Payer: Self-pay

## 2022-08-17 ENCOUNTER — Other Ambulatory Visit (HOSPITAL_BASED_OUTPATIENT_CLINIC_OR_DEPARTMENT_OTHER): Payer: Self-pay

## 2022-08-19 ENCOUNTER — Other Ambulatory Visit (HOSPITAL_BASED_OUTPATIENT_CLINIC_OR_DEPARTMENT_OTHER): Payer: Self-pay

## 2022-08-23 ENCOUNTER — Other Ambulatory Visit (HOSPITAL_BASED_OUTPATIENT_CLINIC_OR_DEPARTMENT_OTHER): Payer: Self-pay

## 2022-08-23 ENCOUNTER — Other Ambulatory Visit: Payer: Self-pay

## 2022-08-31 ENCOUNTER — Other Ambulatory Visit (HOSPITAL_BASED_OUTPATIENT_CLINIC_OR_DEPARTMENT_OTHER): Payer: Self-pay

## 2022-09-01 ENCOUNTER — Other Ambulatory Visit (HOSPITAL_BASED_OUTPATIENT_CLINIC_OR_DEPARTMENT_OTHER): Payer: Self-pay

## 2022-09-08 ENCOUNTER — Other Ambulatory Visit (HOSPITAL_BASED_OUTPATIENT_CLINIC_OR_DEPARTMENT_OTHER): Payer: Self-pay

## 2022-09-09 ENCOUNTER — Other Ambulatory Visit (HOSPITAL_BASED_OUTPATIENT_CLINIC_OR_DEPARTMENT_OTHER): Payer: Self-pay

## 2022-09-10 ENCOUNTER — Other Ambulatory Visit (HOSPITAL_BASED_OUTPATIENT_CLINIC_OR_DEPARTMENT_OTHER): Payer: Self-pay

## 2022-10-14 ENCOUNTER — Other Ambulatory Visit (HOSPITAL_BASED_OUTPATIENT_CLINIC_OR_DEPARTMENT_OTHER): Payer: Self-pay

## 2022-10-14 ENCOUNTER — Other Ambulatory Visit (INDEPENDENT_AMBULATORY_CARE_PROVIDER_SITE_OTHER): Payer: Self-pay | Admitting: Pediatric Endocrinology

## 2022-10-15 ENCOUNTER — Other Ambulatory Visit (HOSPITAL_BASED_OUTPATIENT_CLINIC_OR_DEPARTMENT_OTHER): Payer: Self-pay

## 2022-10-19 ENCOUNTER — Other Ambulatory Visit (HOSPITAL_BASED_OUTPATIENT_CLINIC_OR_DEPARTMENT_OTHER): Payer: Self-pay

## 2022-10-19 MED ORDER — CHOLECALCIFEROL 25 MCG (1000 UT) PO TABS: 1000.0000 [IU] | ORAL_TABLET | Freq: Every day | ORAL | 6 refills | Status: AC

## 2022-10-19 MED ORDER — FERROUS SULFATE 325 (65 FE) MG PO TABS
ORAL_TABLET | ORAL | 2 refills | Status: AC
  Filled 2022-10-19: qty 100, 30d supply, fill #0
  Filled 2022-10-29: qty 100, 300d supply, fill #0

## 2022-10-19 MED ORDER — OPTIMAL D3 1.25 MG (50000 UT) PO CAPS
50000.0000 [IU] | ORAL_CAPSULE | ORAL | 0 refills | Status: DC
  Filled 2022-10-19 – 2022-10-29 (×2): qty 8, 56d supply, fill #0

## 2022-10-26 ENCOUNTER — Other Ambulatory Visit (HOSPITAL_BASED_OUTPATIENT_CLINIC_OR_DEPARTMENT_OTHER): Payer: Self-pay

## 2022-10-29 ENCOUNTER — Other Ambulatory Visit (HOSPITAL_BASED_OUTPATIENT_CLINIC_OR_DEPARTMENT_OTHER): Payer: Self-pay

## 2022-11-10 ENCOUNTER — Other Ambulatory Visit (HOSPITAL_BASED_OUTPATIENT_CLINIC_OR_DEPARTMENT_OTHER): Payer: Self-pay

## 2022-11-10 MED ORDER — NORGESTIM-ETH ESTRAD TRIPHASIC 0.18/0.215/0.25 MG-25 MCG PO TABS
1.0000 | ORAL_TABLET | Freq: Every day | ORAL | 0 refills | Status: DC
  Filled 2022-11-10: qty 84, 84d supply, fill #0

## 2022-11-15 ENCOUNTER — Other Ambulatory Visit (HOSPITAL_BASED_OUTPATIENT_CLINIC_OR_DEPARTMENT_OTHER): Payer: Self-pay

## 2022-11-15 MED ORDER — FREESTYLE LIBRE 3 SENSOR MISC
11 refills | Status: AC
  Filled 2022-11-15 – 2022-12-13 (×2): qty 2, 28d supply, fill #0
  Filled 2023-03-15: qty 2, 28d supply, fill #1
  Filled 2023-04-08: qty 2, 28d supply, fill #2
  Filled 2023-05-19: qty 2, 28d supply, fill #3
  Filled 2023-06-27 – 2023-09-06 (×5): qty 2, 28d supply, fill #4

## 2022-11-16 ENCOUNTER — Other Ambulatory Visit (HOSPITAL_BASED_OUTPATIENT_CLINIC_OR_DEPARTMENT_OTHER): Payer: Self-pay

## 2022-11-18 ENCOUNTER — Other Ambulatory Visit (HOSPITAL_BASED_OUTPATIENT_CLINIC_OR_DEPARTMENT_OTHER): Payer: Self-pay

## 2022-11-22 ENCOUNTER — Other Ambulatory Visit (HOSPITAL_BASED_OUTPATIENT_CLINIC_OR_DEPARTMENT_OTHER): Payer: Self-pay

## 2022-11-23 ENCOUNTER — Other Ambulatory Visit (HOSPITAL_BASED_OUTPATIENT_CLINIC_OR_DEPARTMENT_OTHER): Payer: Self-pay

## 2022-11-25 ENCOUNTER — Other Ambulatory Visit (HOSPITAL_BASED_OUTPATIENT_CLINIC_OR_DEPARTMENT_OTHER): Payer: Self-pay

## 2022-11-29 ENCOUNTER — Other Ambulatory Visit (HOSPITAL_BASED_OUTPATIENT_CLINIC_OR_DEPARTMENT_OTHER): Payer: Self-pay

## 2022-11-30 ENCOUNTER — Other Ambulatory Visit (HOSPITAL_BASED_OUTPATIENT_CLINIC_OR_DEPARTMENT_OTHER): Payer: Self-pay

## 2022-12-02 ENCOUNTER — Other Ambulatory Visit (HOSPITAL_BASED_OUTPATIENT_CLINIC_OR_DEPARTMENT_OTHER): Payer: Self-pay

## 2022-12-06 ENCOUNTER — Other Ambulatory Visit (HOSPITAL_BASED_OUTPATIENT_CLINIC_OR_DEPARTMENT_OTHER): Payer: Self-pay

## 2022-12-07 ENCOUNTER — Other Ambulatory Visit (HOSPITAL_BASED_OUTPATIENT_CLINIC_OR_DEPARTMENT_OTHER): Payer: Self-pay

## 2022-12-09 ENCOUNTER — Other Ambulatory Visit (HOSPITAL_BASED_OUTPATIENT_CLINIC_OR_DEPARTMENT_OTHER): Payer: Self-pay

## 2022-12-13 ENCOUNTER — Other Ambulatory Visit (INDEPENDENT_AMBULATORY_CARE_PROVIDER_SITE_OTHER): Payer: Self-pay | Admitting: Pediatric Endocrinology

## 2022-12-13 ENCOUNTER — Other Ambulatory Visit: Payer: Self-pay

## 2022-12-14 ENCOUNTER — Other Ambulatory Visit (HOSPITAL_BASED_OUTPATIENT_CLINIC_OR_DEPARTMENT_OTHER): Payer: Self-pay

## 2022-12-14 NOTE — Telephone Encounter (Signed)
It has been over a year since I saw her- so legally I cannot prescribe. She can get insulin from ER or Urgent Care or she can come see me. I believe I have an opening this morning.

## 2022-12-15 ENCOUNTER — Encounter (HOSPITAL_BASED_OUTPATIENT_CLINIC_OR_DEPARTMENT_OTHER): Payer: Self-pay

## 2022-12-15 ENCOUNTER — Emergency Department (HOSPITAL_BASED_OUTPATIENT_CLINIC_OR_DEPARTMENT_OTHER)
Admission: EM | Admit: 2022-12-15 | Discharge: 2022-12-16 | Disposition: A | Discharge status: Home / Self Care | Payer: Medicaid Other | Attending: Emergency Medicine | Admitting: Emergency Medicine

## 2022-12-15 ENCOUNTER — Other Ambulatory Visit: Payer: Self-pay

## 2022-12-15 DIAGNOSIS — Z20822 Contact with and (suspected) exposure to covid-19: Secondary | ICD-10-CM | POA: Diagnosis not present

## 2022-12-15 DIAGNOSIS — E039 Hypothyroidism, unspecified: Secondary | ICD-10-CM | POA: Insufficient documentation

## 2022-12-15 DIAGNOSIS — R112 Nausea with vomiting, unspecified: Secondary | ICD-10-CM | POA: Insufficient documentation

## 2022-12-15 DIAGNOSIS — E109 Type 1 diabetes mellitus without complications: Secondary | ICD-10-CM | POA: Diagnosis not present

## 2022-12-15 LAB — CBG MONITORING, ED: Glucose-Capillary: 295 mg/dL — ABNORMAL HIGH (ref 70–99)

## 2022-12-15 LAB — CBC
HCT: 34.3 % — ABNORMAL LOW (ref 36.0–46.0)
Hemoglobin: 10.7 g/dL — ABNORMAL LOW (ref 12.0–15.0)
MCH: 23.7 pg — ABNORMAL LOW (ref 26.0–34.0)
MCHC: 31.2 g/dL (ref 30.0–36.0)
MCV: 76.1 fL — ABNORMAL LOW (ref 80.0–100.0)
Platelets: 318 10*3/uL (ref 150–400)
RBC: 4.51 MIL/uL (ref 3.87–5.11)
RDW: 15.4 % (ref 11.5–15.5)
WBC: 14.1 10*3/uL — ABNORMAL HIGH (ref 4.0–10.5)
nRBC: 0 % (ref 0.0–0.2)

## 2022-12-15 LAB — COMPREHENSIVE METABOLIC PANEL
ALT: 13 U/L (ref 0–44)
AST: 15 U/L (ref 15–41)
Albumin: 3.6 g/dL (ref 3.5–5.0)
Alkaline Phosphatase: 75 U/L (ref 38–126)
Anion gap: 14 (ref 5–15)
BUN: 10 mg/dL (ref 6–20)
CO2: 20 mmol/L — ABNORMAL LOW (ref 22–32)
Calcium: 8.9 mg/dL (ref 8.9–10.3)
Chloride: 97 mmol/L — ABNORMAL LOW (ref 98–111)
Creatinine, Ser: 0.63 mg/dL (ref 0.44–1.00)
GFR, Estimated: >60 mL/min (ref >60)
Glucose, Bld: 314 mg/dL — ABNORMAL HIGH (ref 70–99)
Potassium: 3.7 mmol/L (ref 3.5–5.1)
Sodium: 131 mmol/L — ABNORMAL LOW (ref 135–145)
Total Bilirubin: 1.3 mg/dL — ABNORMAL HIGH (ref 0.3–1.2)
Total Protein: 8 g/dL (ref 6.5–8.1)

## 2022-12-15 LAB — LIPASE, BLOOD: Lipase: 20 U/L (ref 11–51)

## 2022-12-15 LAB — SARS CORONAVIRUS 2 BY RT PCR: SARS Coronavirus 2 by RT PCR: NEGATIVE

## 2022-12-15 MED ORDER — ONDANSETRON 4 MG PO TBDP
4.0000 mg | ORAL_TABLET | Freq: Once | ORAL | Status: AC | PRN
  Administered 2022-12-15: 4 mg via ORAL

## 2022-12-15 MED ORDER — ACETAMINOPHEN 325 MG PO TABS
650.0000 mg | ORAL_TABLET | Freq: Once | ORAL | Status: AC | PRN
  Administered 2022-12-15: 650 mg via ORAL

## 2022-12-15 NOTE — ED Triage Notes (Signed)
Pt reports vomiting, chills, body aches since yesterday.

## 2022-12-16 ENCOUNTER — Other Ambulatory Visit (HOSPITAL_BASED_OUTPATIENT_CLINIC_OR_DEPARTMENT_OTHER): Payer: Self-pay

## 2022-12-16 ENCOUNTER — Other Ambulatory Visit (HOSPITAL_COMMUNITY): Payer: Self-pay

## 2022-12-16 MED ORDER — KETOROLAC TROMETHAMINE 15 MG/ML IJ SOLN
15.0000 mg | Freq: Once | INTRAMUSCULAR | Status: AC
  Administered 2022-12-16: 15 mg via INTRAVENOUS
  Filled 2022-12-16: qty 1

## 2022-12-16 MED ORDER — ONDANSETRON 4 MG PO TBDP
4.0000 mg | ORAL_TABLET | Freq: Three times a day (TID) | ORAL | 0 refills | Status: AC | PRN
  Filled 2022-12-16 (×2): qty 20, 7d supply, fill #0

## 2022-12-16 MED ORDER — LACTATED RINGERS IV BOLUS
1000.0000 mL | Freq: Once | INTRAVENOUS | Status: AC
  Administered 2022-12-16: 1000 mL via INTRAVENOUS

## 2022-12-16 MED ORDER — DICYCLOMINE HCL 20 MG PO TABS
20.0000 mg | ORAL_TABLET | Freq: Two times a day (BID) | ORAL | 0 refills | Status: AC
  Filled 2022-12-16 (×2): qty 20, 10d supply, fill #0

## 2022-12-16 MED ORDER — ONDANSETRON HCL 4 MG/2ML IJ SOLN
4.0000 mg | Freq: Once | INTRAMUSCULAR | Status: AC
  Administered 2022-12-16: 4 mg via INTRAVENOUS
  Filled 2022-12-16: qty 2

## 2022-12-16 MED ORDER — DIPHENHYDRAMINE HCL 50 MG/ML IJ SOLN
25.0000 mg | Freq: Once | INTRAMUSCULAR | Status: AC
  Administered 2022-12-16: 25 mg via INTRAVENOUS
  Filled 2022-12-16: qty 1

## 2022-12-16 MED ORDER — PROCHLORPERAZINE EDISYLATE 10 MG/2ML IJ SOLN
10.0000 mg | Freq: Once | INTRAMUSCULAR | Status: AC
  Administered 2022-12-16: 10 mg via INTRAVENOUS
  Filled 2022-12-16: qty 2

## 2022-12-16 MED ORDER — FENTANYL CITRATE PF 50 MCG/ML IJ SOSY
50.0000 ug | PREFILLED_SYRINGE | Freq: Once | INTRAMUSCULAR | Status: AC
  Administered 2022-12-16: 50 ug via INTRAVENOUS
  Filled 2022-12-16: qty 1

## 2022-12-16 NOTE — Discharge Instructions (Signed)
You were evaluated in the Emergency Department and after careful evaluation, we did not find any emergent condition requiring admission or further testing in the hospital.  Your exam/testing today is overall reassuring.  Use the Zofran as needed for nausea.  Use the Bentyl as needed for cough abdominal pain.  Please return to the Emergency Department if you experience any worsening of your condition.   Thank you for allowing [place] to be a part of your care.

## 2022-12-16 NOTE — ED Provider Notes (Signed)
[facility] Emergency Department Provider Note MRN:  [id number]  Arrival date & time: 12/16/22     Chief Complaint   Abdominal Pain and Emesis   History of Present Illness   [name] Young is a 22 y.o. year-old female with a history of type 1 diabetes presenting to the ED with chief complaint of vomiting.  Started having nausea vomiting body aches and chills yesterday, not going away.  Denies diarrhea, generalized abdominal discomfort, nonfocal.  No chest pain or shortness of breath.  Review of Systems  A thorough review of systems was obtained and all systems are negative except as noted in the HPI and PMH.   Patient's Health History    Past Medical History:  Diagnosis Date   Borderline diabetes    Diabetes mellitus without complication (HCC)    Hypothyroid    Hypothyroidism     History reviewed. No pertinent surgical history.  Family History  Problem Relation Age of Onset   Diabetes Mother    Obesity Mother    Thyroid disease Mother    Diabetes Maternal Grandmother    Hypertension Maternal Grandfather    Thyroid disease Maternal Grandfather    Asthma Brother     Social History   Socioeconomic History   Marital status: Single    Spouse name: Not on file   Number of children: Not on file   Years of education: Not on file   Highest education level: Not on file  Occupational History   Not on file  Tobacco Use   Smoking status: Never   Smokeless tobacco: Never   Tobacco comments:    Grandfather lives with pt and smokes outside only  Substance and Sexual Activity   Alcohol use: No   Drug use: No   Sexual activity: Never  Other Topics Concern   Not on file  Social History Narrative   Lives with mom, dad and younger brother.   Is a Holiday representative at [employer].    She would like to go school for Psychology and or to be a Child psychotherapist.    Social Determinants of Health   Financial Resource Strain: Not on file  Food Insecurity: Not on file   Transportation Needs: Not on file  Physical Activity: Not on file  Stress: Not on file  Social Connections: Not on file  Intimate Partner Violence: Not on file     Physical Exam   Vitals:   12/16/22 0105 12/16/22 0115  BP: 132/82 (!) 140/86  Pulse: 89 88  Resp: 18   Temp:    SpO2: 100% 100%    CONSTITUTIONAL: Well-appearing, NAD NEURO/PSYCH:  Alert and oriented x 3, no focal deficits EYES:  eyes equal and reactive ENT/NECK:  no LAD, no JVD CARDIO: Regular rate, well-perfused, normal S1 and S2 PULM:  CTAB no wheezing or rhonchi GI/GU:  non-distended, non-tender MSK/SPINE:  No gross deformities, no edema SKIN:  no rash, atraumatic   *Additional and/or pertinent findings included in MDM below  Diagnostic and Interventional Summary    EKG Interpretation  Date/Time:    Ventricular Rate:    PR Interval:    QRS Duration:   QT Interval:    QTC Calculation:   R Axis:     Text Interpretation:         Labs Reviewed  COMPREHENSIVE METABOLIC PANEL - Abnormal; Notable for the following components:      Result Value   Sodium 131 (*)    Chloride 97 (*)  CO2 20 (*)    Glucose, Bld 314 (*)    Total Bilirubin 1.3 (*)    All other components within normal limits  CBC - Abnormal; Notable for the following components:   WBC 14.1 (*)    Hemoglobin 10.7 (*)    HCT 34.3 (*)    MCV 76.1 (*)    MCH 23.7 (*)    All other components within normal limits  CBG MONITORING, ED - Abnormal; Notable for the following components:   Glucose-Capillary 295 (*)    All other components within normal limits  SARS CORONAVIRUS 2 BY RT PCR  LIPASE, BLOOD  URINALYSIS, ROUTINE W REFLEX MICROSCOPIC  PREGNANCY, URINE    No orders to display    Medications  ondansetron (ZOFRAN-ODT) disintegrating tablet 4 mg (4 mg Oral Given 12/15/22 2326)  acetaminophen (TYLENOL) tablet 650 mg (650 mg Oral Given 12/15/22 2326)  fentaNYL (SUBLIMAZE) injection 50 mcg (50 mcg Intravenous Given 12/16/22 0021)   ondansetron (ZOFRAN) injection 4 mg (4 mg Intravenous Given 12/16/22 0020)  lactated ringers bolus 1,000 mL (0 mLs Intravenous Stopped 12/16/22 0105)  ketorolac (TORADOL) 15 MG/ML injection 15 mg (15 mg Intravenous Given 12/16/22 0022)  prochlorperazine (COMPAZINE) injection 10 mg (10 mg Intravenous Given 12/16/22 0115)  diphenhydrAMINE (BENADRYL) injection 25 mg (25 mg Intravenous Given 12/16/22 0115)     Procedures  /  Critical Care Procedures  ED Course and Medical Decision Making  Initial Impression and Ddx Patient arrives febrile with nausea vomiting and generalized abdominal discomfort.  Differential diagnosis includes DKA, viral gastroenteritis.  Low concern for significant intra-abdominal bacterial infection given the largely benign exam, soft, nontender, no rebound guarding or rigidity.  Providing symptomatic management and will reassess.  Past medical/surgical history that increases complexity of ED encounter: Type 1 diabetes  Interpretation of Diagnostics I personally reviewed the laboratory assessment and my interpretation is as follows: No significant blood count or electrolyte disturbance, no signs of DKA.  Mild leukocytosis    Patient Reassessment and Ultimate Disposition/Management     Patient feeling a lot better after medications listed above, soft and nontender abdomen, monitored for 4+ hours and seems to be medically quite stable.  She feels better and would like to go home.  Strict return precautions.  Patient management required discussion with the following services or consulting groups:  None  Complexity of Problems Addressed Acute illness or injury that poses threat of life of bodily function  Additional Data Reviewed and Analyzed Further history obtained from: Further history from spouse/family member  Additional Factors Impacting ED Encounter Risk Prescriptions  Elmer Sow. Pilar Plate, MD [facility] Emergency Medicine [facility]  Health [email or web address]  Final Clinical Impressions(s) / ED Diagnoses     ICD-10-CM   1. Nausea and vomiting, unspecified vomiting type  R11.2       ED Discharge Orders          Ordered    ondansetron (ZOFRAN-ODT) 4 MG disintegrating tablet  Every 8 hours PRN        12/16/22 0307    dicyclomine (BENTYL) 20 MG tablet  2 times daily        12/16/22 0307             Discharge Instructions Discussed with and Provided to Patient:     Discharge Instructions      You were evaluated in the Emergency Department and after careful evaluation, we did not find any emergent condition requiring admission or further testing in the hospital.  Your exam/testing today is overall reassuring.  Use the Zofran as needed for nausea.  Use the Bentyl as needed for cough abdominal pain.  Please return to the Emergency Department if you experience any worsening of your condition.   Thank you for allowing [place] to be a part of your care.       Sabas Sous, MD 12/16/22 [id number]

## 2022-12-17 ENCOUNTER — Other Ambulatory Visit (HOSPITAL_COMMUNITY): Payer: Self-pay

## 2022-12-21 ENCOUNTER — Other Ambulatory Visit (HOSPITAL_BASED_OUTPATIENT_CLINIC_OR_DEPARTMENT_OTHER): Payer: Self-pay

## 2022-12-22 ENCOUNTER — Other Ambulatory Visit (HOSPITAL_BASED_OUTPATIENT_CLINIC_OR_DEPARTMENT_OTHER): Payer: Self-pay

## 2022-12-23 ENCOUNTER — Other Ambulatory Visit (HOSPITAL_BASED_OUTPATIENT_CLINIC_OR_DEPARTMENT_OTHER): Payer: Self-pay

## 2022-12-27 ENCOUNTER — Other Ambulatory Visit (HOSPITAL_BASED_OUTPATIENT_CLINIC_OR_DEPARTMENT_OTHER): Payer: Self-pay

## 2022-12-31 ENCOUNTER — Other Ambulatory Visit (HOSPITAL_BASED_OUTPATIENT_CLINIC_OR_DEPARTMENT_OTHER): Payer: Self-pay

## 2023-01-04 ENCOUNTER — Other Ambulatory Visit (HOSPITAL_BASED_OUTPATIENT_CLINIC_OR_DEPARTMENT_OTHER): Payer: Self-pay

## 2023-01-05 ENCOUNTER — Other Ambulatory Visit (HOSPITAL_BASED_OUTPATIENT_CLINIC_OR_DEPARTMENT_OTHER): Payer: Self-pay

## 2023-01-06 ENCOUNTER — Other Ambulatory Visit (HOSPITAL_BASED_OUTPATIENT_CLINIC_OR_DEPARTMENT_OTHER): Payer: Self-pay

## 2023-01-11 ENCOUNTER — Other Ambulatory Visit (HOSPITAL_BASED_OUTPATIENT_CLINIC_OR_DEPARTMENT_OTHER): Payer: Self-pay

## 2023-01-11 MED ORDER — TRESIBA FLEXTOUCH 100 UNIT/ML ~~LOC~~ SOPN
45.0000 [IU] | PEN_INJECTOR | Freq: Every morning | SUBCUTANEOUS | 3 refills | Status: AC
  Filled 2023-01-11 – 2023-02-23 (×3): qty 15, 33d supply, fill #0
  Filled 2023-06-27 – 2023-09-06 (×2): qty 15, 33d supply, fill #1

## 2023-01-11 MED ORDER — INSULIN LISPRO (1 UNIT DIAL) 100 UNIT/ML (KWIKPEN)
15.0000 [IU] | PEN_INJECTOR | Freq: Three times a day (TID) | SUBCUTANEOUS | 5 refills | Status: DC
  Filled 2023-01-11 – 2023-01-27 (×2): qty 15, 34d supply, fill #0
  Filled 2023-02-19 – 2023-02-23 (×2): qty 15, 34d supply, fill #1
  Filled 2023-03-15 – 2023-03-22 (×2): qty 15, 34d supply, fill #2
  Filled 2023-05-10: qty 15, 34d supply, fill #3
  Filled 2023-05-24 – 2023-06-06 (×2): qty 15, 34d supply, fill #4
  Filled 2023-06-27 – 2023-09-06 (×3): qty 15, 34d supply, fill #5

## 2023-01-17 ENCOUNTER — Other Ambulatory Visit (HOSPITAL_BASED_OUTPATIENT_CLINIC_OR_DEPARTMENT_OTHER): Payer: Self-pay

## 2023-01-21 ENCOUNTER — Encounter (INDEPENDENT_AMBULATORY_CARE_PROVIDER_SITE_OTHER): Payer: Self-pay

## 2023-01-21 ENCOUNTER — Other Ambulatory Visit (HOSPITAL_BASED_OUTPATIENT_CLINIC_OR_DEPARTMENT_OTHER): Payer: Self-pay

## 2023-01-27 ENCOUNTER — Other Ambulatory Visit (HOSPITAL_BASED_OUTPATIENT_CLINIC_OR_DEPARTMENT_OTHER): Payer: Self-pay

## 2023-01-28 ENCOUNTER — Other Ambulatory Visit (HOSPITAL_BASED_OUTPATIENT_CLINIC_OR_DEPARTMENT_OTHER): Payer: Self-pay

## 2023-01-31 ENCOUNTER — Other Ambulatory Visit (HOSPITAL_BASED_OUTPATIENT_CLINIC_OR_DEPARTMENT_OTHER): Payer: Self-pay

## 2023-02-01 ENCOUNTER — Other Ambulatory Visit (HOSPITAL_BASED_OUTPATIENT_CLINIC_OR_DEPARTMENT_OTHER): Payer: Self-pay

## 2023-02-02 ENCOUNTER — Other Ambulatory Visit (HOSPITAL_BASED_OUTPATIENT_CLINIC_OR_DEPARTMENT_OTHER): Payer: Self-pay

## 2023-02-03 ENCOUNTER — Encounter (INDEPENDENT_AMBULATORY_CARE_PROVIDER_SITE_OTHER): Payer: Self-pay

## 2023-02-03 ENCOUNTER — Other Ambulatory Visit (HOSPITAL_BASED_OUTPATIENT_CLINIC_OR_DEPARTMENT_OTHER): Payer: Self-pay

## 2023-02-09 ENCOUNTER — Other Ambulatory Visit (HOSPITAL_BASED_OUTPATIENT_CLINIC_OR_DEPARTMENT_OTHER): Payer: Self-pay

## 2023-02-09 MED ORDER — NORGESTIM-ETH ESTRAD TRIPHASIC 0.18/0.215/0.25 MG-25 MCG PO TABS
1.0000 | ORAL_TABLET | Freq: Every day | ORAL | 3 refills | Status: DC
  Filled 2023-02-18: qty 84, 84d supply, fill #0
  Filled 2023-04-17: qty 84, 84d supply, fill #1
  Filled 2023-04-18: qty 28, 28d supply, fill #1
  Filled 2023-06-10: qty 28, 28d supply, fill #0
  Filled 2023-06-10: qty 28, 28d supply, fill #2
  Filled 2023-06-27 – 2023-07-18 (×2): qty 28, 28d supply, fill #1
  Filled 2023-08-11: qty 28, 28d supply, fill #2
  Filled 2023-09-06: qty 28, 28d supply, fill #3
  Filled 2023-10-08: qty 28, 28d supply, fill #4
  Filled 2023-10-26 – 2023-11-02 (×2): qty 28, 28d supply, fill #5
  Filled 2023-11-24: qty 28, 28d supply, fill #6

## 2023-02-18 ENCOUNTER — Other Ambulatory Visit (HOSPITAL_BASED_OUTPATIENT_CLINIC_OR_DEPARTMENT_OTHER): Payer: Self-pay

## 2023-02-21 ENCOUNTER — Other Ambulatory Visit (HOSPITAL_BASED_OUTPATIENT_CLINIC_OR_DEPARTMENT_OTHER): Payer: Self-pay

## 2023-02-21 ENCOUNTER — Other Ambulatory Visit: Payer: Self-pay

## 2023-02-23 ENCOUNTER — Other Ambulatory Visit (HOSPITAL_BASED_OUTPATIENT_CLINIC_OR_DEPARTMENT_OTHER): Payer: Self-pay

## 2023-02-28 ENCOUNTER — Other Ambulatory Visit (HOSPITAL_BASED_OUTPATIENT_CLINIC_OR_DEPARTMENT_OTHER): Payer: Self-pay

## 2023-03-15 ENCOUNTER — Other Ambulatory Visit (HOSPITAL_BASED_OUTPATIENT_CLINIC_OR_DEPARTMENT_OTHER): Payer: Self-pay

## 2023-03-22 ENCOUNTER — Other Ambulatory Visit (HOSPITAL_BASED_OUTPATIENT_CLINIC_OR_DEPARTMENT_OTHER): Payer: Self-pay

## 2023-04-18 ENCOUNTER — Other Ambulatory Visit (HOSPITAL_BASED_OUTPATIENT_CLINIC_OR_DEPARTMENT_OTHER): Payer: Self-pay

## 2023-05-10 ENCOUNTER — Other Ambulatory Visit (HOSPITAL_BASED_OUTPATIENT_CLINIC_OR_DEPARTMENT_OTHER): Payer: Self-pay

## 2023-05-24 ENCOUNTER — Other Ambulatory Visit (HOSPITAL_BASED_OUTPATIENT_CLINIC_OR_DEPARTMENT_OTHER): Payer: Self-pay

## 2023-05-27 ENCOUNTER — Other Ambulatory Visit (HOSPITAL_BASED_OUTPATIENT_CLINIC_OR_DEPARTMENT_OTHER): Payer: Self-pay

## 2023-06-06 ENCOUNTER — Other Ambulatory Visit: Payer: Self-pay

## 2023-06-06 ENCOUNTER — Other Ambulatory Visit (HOSPITAL_BASED_OUTPATIENT_CLINIC_OR_DEPARTMENT_OTHER): Payer: Self-pay

## 2023-06-10 ENCOUNTER — Other Ambulatory Visit (HOSPITAL_BASED_OUTPATIENT_CLINIC_OR_DEPARTMENT_OTHER): Payer: Self-pay

## 2023-06-27 ENCOUNTER — Other Ambulatory Visit (HOSPITAL_BASED_OUTPATIENT_CLINIC_OR_DEPARTMENT_OTHER): Payer: Self-pay

## 2023-06-28 ENCOUNTER — Other Ambulatory Visit (HOSPITAL_BASED_OUTPATIENT_CLINIC_OR_DEPARTMENT_OTHER): Payer: Self-pay

## 2023-06-28 ENCOUNTER — Encounter (HOSPITAL_BASED_OUTPATIENT_CLINIC_OR_DEPARTMENT_OTHER): Payer: Self-pay

## 2023-06-30 ENCOUNTER — Other Ambulatory Visit (HOSPITAL_BASED_OUTPATIENT_CLINIC_OR_DEPARTMENT_OTHER): Payer: Self-pay

## 2023-07-04 ENCOUNTER — Other Ambulatory Visit (HOSPITAL_BASED_OUTPATIENT_CLINIC_OR_DEPARTMENT_OTHER): Payer: Self-pay

## 2023-07-06 ENCOUNTER — Other Ambulatory Visit (HOSPITAL_BASED_OUTPATIENT_CLINIC_OR_DEPARTMENT_OTHER): Payer: Self-pay

## 2023-07-11 ENCOUNTER — Other Ambulatory Visit (HOSPITAL_BASED_OUTPATIENT_CLINIC_OR_DEPARTMENT_OTHER): Payer: Self-pay

## 2023-07-12 ENCOUNTER — Other Ambulatory Visit (HOSPITAL_BASED_OUTPATIENT_CLINIC_OR_DEPARTMENT_OTHER): Payer: Self-pay

## 2023-07-18 ENCOUNTER — Other Ambulatory Visit (HOSPITAL_BASED_OUTPATIENT_CLINIC_OR_DEPARTMENT_OTHER): Payer: Self-pay

## 2023-08-11 ENCOUNTER — Other Ambulatory Visit (HOSPITAL_BASED_OUTPATIENT_CLINIC_OR_DEPARTMENT_OTHER): Payer: Self-pay

## 2023-08-18 ENCOUNTER — Other Ambulatory Visit (HOSPITAL_BASED_OUTPATIENT_CLINIC_OR_DEPARTMENT_OTHER): Payer: Self-pay

## 2023-08-19 ENCOUNTER — Other Ambulatory Visit (HOSPITAL_BASED_OUTPATIENT_CLINIC_OR_DEPARTMENT_OTHER): Payer: Self-pay

## 2023-08-22 ENCOUNTER — Other Ambulatory Visit (HOSPITAL_BASED_OUTPATIENT_CLINIC_OR_DEPARTMENT_OTHER): Payer: Self-pay

## 2023-08-23 ENCOUNTER — Other Ambulatory Visit (HOSPITAL_BASED_OUTPATIENT_CLINIC_OR_DEPARTMENT_OTHER): Payer: Self-pay

## 2023-08-24 ENCOUNTER — Other Ambulatory Visit (HOSPITAL_BASED_OUTPATIENT_CLINIC_OR_DEPARTMENT_OTHER): Payer: Self-pay

## 2023-08-25 ENCOUNTER — Other Ambulatory Visit (HOSPITAL_BASED_OUTPATIENT_CLINIC_OR_DEPARTMENT_OTHER): Payer: Self-pay

## 2023-08-26 ENCOUNTER — Other Ambulatory Visit (HOSPITAL_BASED_OUTPATIENT_CLINIC_OR_DEPARTMENT_OTHER): Payer: Self-pay

## 2023-08-29 ENCOUNTER — Other Ambulatory Visit (HOSPITAL_BASED_OUTPATIENT_CLINIC_OR_DEPARTMENT_OTHER): Payer: Self-pay

## 2023-08-30 ENCOUNTER — Other Ambulatory Visit (HOSPITAL_BASED_OUTPATIENT_CLINIC_OR_DEPARTMENT_OTHER): Payer: Self-pay

## 2023-08-31 ENCOUNTER — Other Ambulatory Visit (HOSPITAL_BASED_OUTPATIENT_CLINIC_OR_DEPARTMENT_OTHER): Payer: Self-pay

## 2023-09-01 ENCOUNTER — Other Ambulatory Visit (HOSPITAL_BASED_OUTPATIENT_CLINIC_OR_DEPARTMENT_OTHER): Payer: Self-pay

## 2023-09-02 ENCOUNTER — Other Ambulatory Visit (HOSPITAL_BASED_OUTPATIENT_CLINIC_OR_DEPARTMENT_OTHER): Payer: Self-pay

## 2023-09-03 ENCOUNTER — Other Ambulatory Visit (HOSPITAL_BASED_OUTPATIENT_CLINIC_OR_DEPARTMENT_OTHER): Payer: Self-pay

## 2023-09-04 ENCOUNTER — Other Ambulatory Visit (HOSPITAL_BASED_OUTPATIENT_CLINIC_OR_DEPARTMENT_OTHER): Payer: Self-pay

## 2023-09-05 ENCOUNTER — Other Ambulatory Visit (HOSPITAL_BASED_OUTPATIENT_CLINIC_OR_DEPARTMENT_OTHER): Payer: Self-pay

## 2023-09-06 ENCOUNTER — Other Ambulatory Visit (HOSPITAL_BASED_OUTPATIENT_CLINIC_OR_DEPARTMENT_OTHER): Payer: Self-pay

## 2023-09-06 ENCOUNTER — Other Ambulatory Visit: Payer: Self-pay

## 2023-09-07 ENCOUNTER — Other Ambulatory Visit (HOSPITAL_BASED_OUTPATIENT_CLINIC_OR_DEPARTMENT_OTHER): Payer: Self-pay

## 2023-09-07 MED ORDER — FREESTYLE LIBRE 3 SENSOR MISC
1.0000 | 0 refills | Status: AC
  Filled 2023-09-07: qty 1, 14d supply, fill #0

## 2023-09-08 ENCOUNTER — Other Ambulatory Visit (HOSPITAL_BASED_OUTPATIENT_CLINIC_OR_DEPARTMENT_OTHER): Payer: Self-pay

## 2023-09-09 ENCOUNTER — Other Ambulatory Visit (HOSPITAL_BASED_OUTPATIENT_CLINIC_OR_DEPARTMENT_OTHER): Payer: Self-pay

## 2023-09-12 ENCOUNTER — Other Ambulatory Visit (HOSPITAL_BASED_OUTPATIENT_CLINIC_OR_DEPARTMENT_OTHER): Payer: Self-pay

## 2023-09-13 ENCOUNTER — Other Ambulatory Visit (HOSPITAL_BASED_OUTPATIENT_CLINIC_OR_DEPARTMENT_OTHER): Payer: Self-pay

## 2023-09-14 ENCOUNTER — Other Ambulatory Visit (HOSPITAL_BASED_OUTPATIENT_CLINIC_OR_DEPARTMENT_OTHER): Payer: Self-pay

## 2023-09-14 MED ORDER — INSULIN LISPRO (1 UNIT DIAL) 100 UNIT/ML (KWIKPEN)
15.0000 [IU] | PEN_INJECTOR | Freq: Three times a day (TID) | SUBCUTANEOUS | 5 refills | Status: DC
  Filled 2023-09-14 – 2023-10-25 (×2): qty 15, 34d supply, fill #0
  Filled 2023-11-24: qty 15, 34d supply, fill #1
  Filled 2023-12-25: qty 15, 34d supply, fill #2
  Filled 2024-01-29: qty 15, 34d supply, fill #3
  Filled 2024-02-25: qty 15, 34d supply, fill #4
  Filled 2024-03-21 – 2024-04-21 (×3): qty 15, 34d supply, fill #5

## 2023-09-14 MED ORDER — TRESIBA FLEXTOUCH 100 UNIT/ML ~~LOC~~ SOPN
45.0000 [IU] | PEN_INJECTOR | Freq: Every morning | SUBCUTANEOUS | 3 refills | Status: DC
  Filled 2023-10-25: qty 15, 33d supply, fill #0
  Filled 2023-11-24: qty 15, 33d supply, fill #1

## 2023-09-14 MED ORDER — FREESTYLE LIBRE 3 PLUS SENSOR MISC
11 refills | Status: AC
  Filled 2023-10-08: qty 2, 28d supply, fill #0
  Filled 2023-11-22: qty 2, 28d supply, fill #1

## 2023-09-14 MED ORDER — NORGESTIMATE-ETH ESTRADIOL 0.18/0.215/0.25 MG-25 MCG PO TABS
1.0000 | ORAL_TABLET | Freq: Every day | ORAL | 3 refills | Status: AC
  Filled 2023-09-14 – 2023-12-19 (×2): qty 84, 84d supply, fill #0
  Filled 2024-03-21: qty 84, 84d supply, fill #1
  Filled 2024-04-21: qty 84, 84d supply, fill #2

## 2023-09-15 ENCOUNTER — Other Ambulatory Visit (HOSPITAL_BASED_OUTPATIENT_CLINIC_OR_DEPARTMENT_OTHER): Payer: Self-pay

## 2023-09-15 MED ORDER — FREESTYLE LIBRE 3 PLUS SENSOR MISC
1.0000 | 11 refills | Status: AC
  Filled 2023-09-15: qty 2, 30d supply, fill #0
  Filled 2024-07-07 (×2): qty 2, 30d supply, fill #1

## 2023-09-15 MED ORDER — ACCRUFER 30 MG PO CAPS
30.0000 mg | ORAL_CAPSULE | Freq: Two times a day (BID) | ORAL | 1 refills | Status: DC
  Filled 2023-09-15: qty 60, 30d supply, fill #0

## 2023-09-16 ENCOUNTER — Other Ambulatory Visit (HOSPITAL_BASED_OUTPATIENT_CLINIC_OR_DEPARTMENT_OTHER): Payer: Self-pay

## 2023-09-16 ENCOUNTER — Other Ambulatory Visit: Payer: Self-pay

## 2023-09-19 ENCOUNTER — Other Ambulatory Visit (HOSPITAL_BASED_OUTPATIENT_CLINIC_OR_DEPARTMENT_OTHER): Payer: Self-pay

## 2023-09-19 MED ORDER — FREESTYLE LIBRE 3 PLUS SENSOR MISC
1.0000 | 11 refills | Status: AC
  Filled 2023-09-19 – 2023-12-19 (×3): qty 2, 30d supply, fill #0
  Filled 2024-01-29: qty 2, 30d supply, fill #1
  Filled 2024-03-21: qty 2, 30d supply, fill #2
  Filled 2024-07-25 – 2024-08-08 (×3): qty 2, 30d supply, fill #3

## 2023-09-20 ENCOUNTER — Other Ambulatory Visit (HOSPITAL_BASED_OUTPATIENT_CLINIC_OR_DEPARTMENT_OTHER): Payer: Self-pay

## 2023-10-04 ENCOUNTER — Other Ambulatory Visit (HOSPITAL_BASED_OUTPATIENT_CLINIC_OR_DEPARTMENT_OTHER): Payer: Self-pay

## 2023-10-08 ENCOUNTER — Other Ambulatory Visit (HOSPITAL_BASED_OUTPATIENT_CLINIC_OR_DEPARTMENT_OTHER): Payer: Self-pay

## 2023-10-10 ENCOUNTER — Other Ambulatory Visit (HOSPITAL_BASED_OUTPATIENT_CLINIC_OR_DEPARTMENT_OTHER): Payer: Self-pay

## 2023-10-25 ENCOUNTER — Other Ambulatory Visit (HOSPITAL_BASED_OUTPATIENT_CLINIC_OR_DEPARTMENT_OTHER): Payer: Self-pay

## 2023-10-26 ENCOUNTER — Other Ambulatory Visit (HOSPITAL_BASED_OUTPATIENT_CLINIC_OR_DEPARTMENT_OTHER): Payer: Self-pay

## 2023-11-28 ENCOUNTER — Other Ambulatory Visit (HOSPITAL_COMMUNITY): Payer: Self-pay

## 2023-12-14 ENCOUNTER — Other Ambulatory Visit (HOSPITAL_BASED_OUTPATIENT_CLINIC_OR_DEPARTMENT_OTHER): Payer: Self-pay

## 2023-12-14 MED ORDER — LANTUS SOLOSTAR 100 UNIT/ML ~~LOC~~ SOPN
25.0000 [IU] | PEN_INJECTOR | Freq: Every day | SUBCUTANEOUS | 3 refills | Status: DC
  Filled 2023-12-14: qty 15, 60d supply, fill #0
  Filled 2024-01-29: qty 15, 60d supply, fill #1
  Filled 2024-02-25 – 2024-03-21 (×2): qty 15, 60d supply, fill #2
  Filled 2024-05-07 (×2): qty 15, 60d supply, fill #3

## 2023-12-15 ENCOUNTER — Other Ambulatory Visit (HOSPITAL_BASED_OUTPATIENT_CLINIC_OR_DEPARTMENT_OTHER): Payer: Self-pay

## 2023-12-15 MED ORDER — ACCRUFER 30 MG PO CAPS
30.0000 mg | ORAL_CAPSULE | Freq: Two times a day (BID) | ORAL | 2 refills | Status: AC
  Filled 2023-12-15: qty 60, 30d supply, fill #0

## 2023-12-16 ENCOUNTER — Other Ambulatory Visit (HOSPITAL_BASED_OUTPATIENT_CLINIC_OR_DEPARTMENT_OTHER): Payer: Self-pay

## 2023-12-16 MED ORDER — ACCRUFER 30 MG PO CAPS
30.0000 mg | ORAL_CAPSULE | Freq: Two times a day (BID) | ORAL | 2 refills | Status: AC
  Filled 2023-12-16: qty 60, 30d supply, fill #0

## 2023-12-19 ENCOUNTER — Other Ambulatory Visit (HOSPITAL_BASED_OUTPATIENT_CLINIC_OR_DEPARTMENT_OTHER): Payer: Self-pay

## 2023-12-20 ENCOUNTER — Other Ambulatory Visit (HOSPITAL_BASED_OUTPATIENT_CLINIC_OR_DEPARTMENT_OTHER): Payer: Self-pay

## 2023-12-23 ENCOUNTER — Other Ambulatory Visit (HOSPITAL_BASED_OUTPATIENT_CLINIC_OR_DEPARTMENT_OTHER): Payer: Self-pay

## 2023-12-25 ENCOUNTER — Other Ambulatory Visit (HOSPITAL_BASED_OUTPATIENT_CLINIC_OR_DEPARTMENT_OTHER): Payer: Self-pay

## 2023-12-26 ENCOUNTER — Other Ambulatory Visit: Payer: Self-pay

## 2023-12-26 ENCOUNTER — Other Ambulatory Visit (HOSPITAL_BASED_OUTPATIENT_CLINIC_OR_DEPARTMENT_OTHER): Payer: Self-pay

## 2023-12-26 ENCOUNTER — Encounter (HOSPITAL_BASED_OUTPATIENT_CLINIC_OR_DEPARTMENT_OTHER): Payer: Self-pay

## 2023-12-26 MED ORDER — ACCU-CHEK GUIDE TEST VI STRP
ORAL_STRIP | 10 refills | Status: AC
  Filled 2023-12-26: qty 100, 25d supply, fill #0
  Filled 2024-01-29: qty 100, 25d supply, fill #1
  Filled 2024-02-25: qty 100, 25d supply, fill #2

## 2023-12-28 ENCOUNTER — Other Ambulatory Visit (HOSPITAL_BASED_OUTPATIENT_CLINIC_OR_DEPARTMENT_OTHER): Payer: Self-pay

## 2023-12-28 MED ORDER — FREESTYLE LIBRE 3 SENSOR MISC
1.0000 | 12 refills | Status: AC
  Filled 2023-12-28 – 2024-02-21 (×3): qty 2, 28d supply, fill #0
  Filled 2024-04-21 – 2024-05-07 (×3): qty 2, 28d supply, fill #1
  Filled 2024-06-06: qty 2, 28d supply, fill #2
  Filled 2024-08-05: qty 2, 28d supply, fill #3

## 2024-01-30 ENCOUNTER — Other Ambulatory Visit: Payer: Self-pay

## 2024-02-17 ENCOUNTER — Other Ambulatory Visit (HOSPITAL_BASED_OUTPATIENT_CLINIC_OR_DEPARTMENT_OTHER): Payer: Self-pay

## 2024-02-21 ENCOUNTER — Other Ambulatory Visit (HOSPITAL_BASED_OUTPATIENT_CLINIC_OR_DEPARTMENT_OTHER): Payer: Self-pay

## 2024-02-25 ENCOUNTER — Other Ambulatory Visit (HOSPITAL_BASED_OUTPATIENT_CLINIC_OR_DEPARTMENT_OTHER): Payer: Self-pay

## 2024-02-26 ENCOUNTER — Other Ambulatory Visit (HOSPITAL_BASED_OUTPATIENT_CLINIC_OR_DEPARTMENT_OTHER): Payer: Self-pay

## 2024-02-27 ENCOUNTER — Other Ambulatory Visit: Payer: Self-pay

## 2024-03-21 ENCOUNTER — Other Ambulatory Visit (HOSPITAL_BASED_OUTPATIENT_CLINIC_OR_DEPARTMENT_OTHER): Payer: Self-pay

## 2024-03-21 ENCOUNTER — Other Ambulatory Visit: Payer: Self-pay

## 2024-04-05 ENCOUNTER — Other Ambulatory Visit (HOSPITAL_BASED_OUTPATIENT_CLINIC_OR_DEPARTMENT_OTHER): Payer: Self-pay

## 2024-04-21 ENCOUNTER — Other Ambulatory Visit (HOSPITAL_BASED_OUTPATIENT_CLINIC_OR_DEPARTMENT_OTHER): Payer: Self-pay

## 2024-04-23 ENCOUNTER — Other Ambulatory Visit: Payer: Self-pay

## 2024-04-23 ENCOUNTER — Other Ambulatory Visit (HOSPITAL_BASED_OUTPATIENT_CLINIC_OR_DEPARTMENT_OTHER): Payer: Self-pay

## 2024-04-23 MED ORDER — NORGESTIMATE-ETH ESTRADIOL 0.18/0.215/0.25 MG-25 MCG PO TABS
1.0000 | ORAL_TABLET | Freq: Every day | ORAL | 3 refills | Status: AC
  Filled 2024-06-06: qty 84, 84d supply, fill #0

## 2024-05-03 ENCOUNTER — Other Ambulatory Visit (HOSPITAL_BASED_OUTPATIENT_CLINIC_OR_DEPARTMENT_OTHER): Payer: Self-pay

## 2024-05-07 ENCOUNTER — Other Ambulatory Visit (HOSPITAL_BASED_OUTPATIENT_CLINIC_OR_DEPARTMENT_OTHER): Payer: Self-pay

## 2024-05-09 ENCOUNTER — Other Ambulatory Visit (HOSPITAL_BASED_OUTPATIENT_CLINIC_OR_DEPARTMENT_OTHER): Payer: Self-pay

## 2024-05-09 MED ORDER — INSULIN LISPRO (1 UNIT DIAL) 100 UNIT/ML (KWIKPEN)
15.0000 [IU] | PEN_INJECTOR | Freq: Three times a day (TID) | SUBCUTANEOUS | 5 refills | Status: AC
  Filled 2024-05-09 – 2024-05-17 (×5): qty 15, 34d supply, fill #0
  Filled 2024-06-06 – 2024-06-12 (×2): qty 15, 34d supply, fill #1
  Filled 2024-07-10: qty 15, 34d supply, fill #2
  Filled 2024-08-05: qty 15, 34d supply, fill #3

## 2024-05-10 ENCOUNTER — Other Ambulatory Visit (HOSPITAL_BASED_OUTPATIENT_CLINIC_OR_DEPARTMENT_OTHER): Payer: Self-pay

## 2024-05-17 ENCOUNTER — Other Ambulatory Visit (HOSPITAL_COMMUNITY): Payer: Self-pay

## 2024-05-17 ENCOUNTER — Other Ambulatory Visit (HOSPITAL_BASED_OUTPATIENT_CLINIC_OR_DEPARTMENT_OTHER): Payer: Self-pay

## 2024-05-17 ENCOUNTER — Other Ambulatory Visit: Payer: Self-pay

## 2024-06-06 ENCOUNTER — Other Ambulatory Visit (HOSPITAL_BASED_OUTPATIENT_CLINIC_OR_DEPARTMENT_OTHER): Payer: Self-pay

## 2024-06-06 ENCOUNTER — Other Ambulatory Visit: Payer: Self-pay

## 2024-06-07 ENCOUNTER — Other Ambulatory Visit (HOSPITAL_BASED_OUTPATIENT_CLINIC_OR_DEPARTMENT_OTHER): Payer: Self-pay

## 2024-06-07 MED ORDER — LANTUS SOLOSTAR 100 UNIT/ML ~~LOC~~ SOPN
34.0000 [IU] | PEN_INJECTOR | Freq: Every day | SUBCUTANEOUS | 3 refills | Status: AC
  Filled 2024-06-07: qty 15, 38d supply, fill #0
  Filled 2024-06-21 – 2024-07-10 (×2): qty 15, 44d supply, fill #0
  Filled 2024-08-05: qty 15, 44d supply, fill #1

## 2024-06-12 ENCOUNTER — Other Ambulatory Visit (HOSPITAL_BASED_OUTPATIENT_CLINIC_OR_DEPARTMENT_OTHER): Payer: Self-pay

## 2024-06-21 ENCOUNTER — Other Ambulatory Visit (HOSPITAL_BASED_OUTPATIENT_CLINIC_OR_DEPARTMENT_OTHER): Payer: Self-pay

## 2024-07-02 ENCOUNTER — Other Ambulatory Visit (HOSPITAL_BASED_OUTPATIENT_CLINIC_OR_DEPARTMENT_OTHER): Payer: Self-pay

## 2024-07-07 ENCOUNTER — Other Ambulatory Visit (HOSPITAL_BASED_OUTPATIENT_CLINIC_OR_DEPARTMENT_OTHER): Payer: Self-pay

## 2024-07-10 ENCOUNTER — Other Ambulatory Visit (HOSPITAL_BASED_OUTPATIENT_CLINIC_OR_DEPARTMENT_OTHER): Payer: Self-pay

## 2024-07-25 ENCOUNTER — Other Ambulatory Visit (HOSPITAL_BASED_OUTPATIENT_CLINIC_OR_DEPARTMENT_OTHER): Payer: Self-pay

## 2024-08-05 ENCOUNTER — Other Ambulatory Visit (HOSPITAL_BASED_OUTPATIENT_CLINIC_OR_DEPARTMENT_OTHER): Payer: Self-pay

## 2024-08-06 ENCOUNTER — Other Ambulatory Visit: Payer: Self-pay

## 2024-08-06 ENCOUNTER — Other Ambulatory Visit (HOSPITAL_BASED_OUTPATIENT_CLINIC_OR_DEPARTMENT_OTHER): Payer: Self-pay

## 2024-08-08 ENCOUNTER — Other Ambulatory Visit (HOSPITAL_BASED_OUTPATIENT_CLINIC_OR_DEPARTMENT_OTHER): Payer: Self-pay
